# Patient Record
Sex: Male | Born: 1955 | Race: White | Hispanic: No | Marital: Married | Smoking: Former smoker
Health system: Southern US, Community
[De-identification: ages and names within clinical notes are randomized; demographics above are authoritative.]

## PROBLEM LIST (undated history)

## (undated) DIAGNOSIS — Z87442 Personal history of urinary calculi: Secondary | ICD-10-CM

## (undated) DIAGNOSIS — I714 Abdominal aortic aneurysm, without rupture, unspecified: Secondary | ICD-10-CM

## (undated) DIAGNOSIS — I639 Cerebral infarction, unspecified: Secondary | ICD-10-CM

## (undated) DIAGNOSIS — M1990 Unspecified osteoarthritis, unspecified site: Secondary | ICD-10-CM

## (undated) DIAGNOSIS — M545 Low back pain, unspecified: Secondary | ICD-10-CM

## (undated) DIAGNOSIS — G8929 Other chronic pain: Secondary | ICD-10-CM

## (undated) DIAGNOSIS — M25569 Pain in unspecified knee: Secondary | ICD-10-CM

## (undated) DIAGNOSIS — N200 Calculus of kidney: Secondary | ICD-10-CM

## (undated) DIAGNOSIS — G373 Acute transverse myelitis in demyelinating disease of central nervous system: Secondary | ICD-10-CM

## (undated) DIAGNOSIS — T4145XA Adverse effect of unspecified anesthetic, initial encounter: Secondary | ICD-10-CM

## (undated) DIAGNOSIS — T8859XA Other complications of anesthesia, initial encounter: Secondary | ICD-10-CM

## (undated) HISTORY — DX: Cerebral infarction, unspecified: I63.9

## (undated) HISTORY — PX: OTHER SURGICAL HISTORY: SHX169

## (undated) HISTORY — PX: BACK SURGERY: SHX140

## (undated) HISTORY — PX: KNEE SURGERY: SHX244

## (undated) HISTORY — PX: INCISION AND DRAINAGE PERIRECTAL ABSCESS: SHX1804

---
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
## 2002-05-24 DIAGNOSIS — I639 Cerebral infarction, unspecified: Secondary | ICD-10-CM

## 2002-05-24 DIAGNOSIS — G373 Acute transverse myelitis in demyelinating disease of central nervous system: Secondary | ICD-10-CM

## 2002-05-24 HISTORY — DX: Cerebral infarction, unspecified: I63.9

## 2002-05-24 HISTORY — DX: Acute transverse myelitis in demyelinating disease of central nervous system: G37.3

## 2011-04-24 HISTORY — PX: SPINE SURGERY: SHX786

## 2011-05-05 HISTORY — PX: ANAL SPHINCTEROTOMY: SHX1140

## 2011-05-31 ENCOUNTER — Encounter: Payer: Self-pay | Admitting: Vascular Surgery

## 2011-06-01 ENCOUNTER — Ambulatory Visit (INDEPENDENT_AMBULATORY_CARE_PROVIDER_SITE_OTHER): Payer: Medicare Other | Admitting: Vascular Surgery

## 2011-06-01 ENCOUNTER — Encounter: Payer: Self-pay | Admitting: Vascular Surgery

## 2011-06-01 VITALS — BP 127/84 | HR 52 | Resp 16 | Ht 73.0 in | Wt 195.0 lb

## 2011-06-01 DIAGNOSIS — I714 Abdominal aortic aneurysm, without rupture, unspecified: Secondary | ICD-10-CM | POA: Insufficient documentation

## 2011-06-01 DIAGNOSIS — Z01818 Encounter for other preprocedural examination: Secondary | ICD-10-CM

## 2011-06-01 NOTE — Progress Notes (Signed)
Addended by: Sharee Pimple on: 06/01/2011 11:37 AM   Modules accepted: Orders

## 2011-06-01 NOTE — Progress Notes (Signed)
Subjective:     Patient ID: Joshua Bailey, male   DOB: 1956-03-06, 56 y.o.   MRN: [id number]  HPI this 56 year old male was referred for an abdominal aortic aneurysm which was recently discovered by his medical doctor Dr.Ruehle in [facility] the patient was noted to have a pulsatile mass on physical exam and ultrasound confirmed a abdominal aortic aneurysm with a maximum diameter of 5.2 cm. He had no previous knowledge of the aneurysm. He has had no abdominal or new back pain. He does have chronic back problems and has had lumbar surgery on multiple occasions.  Past Medical History  Diagnosis Date  . Stroke 2004    History  Substance Use Topics  . Smoking status: Current Everyday Smoker -- 0.5 packs/day    Types: Cigarettes  . Smokeless tobacco: Not on file  . Alcohol Use: Not on file    Family History  Problem Relation Age of Onset  . Hyperlipidemia Mother   . Heart disease Father   . Hyperlipidemia Father   . Stroke Father   . Peripheral vascular disease Father   . Heart disease Brother   . Hyperlipidemia Brother     Not on File  Current outpatient prescriptions:ALPRAZolam (XANAX) 1 MG tablet, Take 1 mg by mouth at bedtime as needed.  , Disp: , Rfl: ;  ascorbic acid (VITAMIN C) 1000 MG tablet, Take 1,000 mg by mouth daily.  , Disp: , Rfl: ;  aspirin 81 MG tablet, Take 160 mg by mouth every other day.  , Disp: , Rfl: ;  cholecalciferol (VITAMIN D) 1000 UNITS tablet, Take 1,000 Units by mouth daily.  , Disp: , Rfl:  HYDROcodone-acetaminophen (NORCO) 10-325 MG per tablet, Take 1 tablet by mouth QID.  , Disp: , Rfl: ;  Omega-3 Fatty Acids (FISH OIL) 1200 MG CAPS, Take by mouth 2 (two) times daily after a meal.  , Disp: , Rfl:   BP 127/84  Pulse 52  Resp 16  Ht 6\' 1"  (1.854 m)  Wt 195 lb (88.451 kg)  BMI 25.73 kg/m2  SpO2 98%  Body mass index is 25.73 kg/(m^2).         Review of Systems he does complain of chronic back pain, numbness and some weakness on the left  side of his body since he had a stroke in 2005, denies chest pain, dyspnea on exertion, PND, orthopnea, hemoptysis, or abdominal GI symptoms. All other systems are negative.    Objective:   Physical Exam blood pressure 127/84 heart rate 60 respirations 16 General well-developed well-nourished male no apparent distress alert and oriented x3 HEENT normal for age Lungs no rhonchi or wheezing Cardiovascular regular rhythm no murmurs carotid pulses 3+ no audible bruits Abdomen soft. There is a 5-6 cm pulsatile mass in the periumbilical region. This is nontender. Neurologic slight decreased sensation in the left lower extremity otherwise normal Muscle skeletal free of major deformities Lower extremity exam reveals 3+ femoral popliteal dorsalis pedis and posterior tibial pulses palpable bilaterally.     Assessment:    abdominal aortic aneurysm 5.2 x 4.7 cm by ultrasound performed in December 2012 we'll schedule CT angiogram of the abdominal aorta and Cardiolite exam to evaluate for underlying coronary artery disease. Patient will return to see me in 2 weeks for further discussion    Plan:

## 2011-06-07 ENCOUNTER — Ambulatory Visit (HOSPITAL_COMMUNITY): Payer: Medicare Other | Attending: Vascular Surgery | Admitting: Radiology

## 2011-06-07 DIAGNOSIS — I739 Peripheral vascular disease, unspecified: Secondary | ICD-10-CM | POA: Insufficient documentation

## 2011-06-07 DIAGNOSIS — Z01818 Encounter for other preprocedural examination: Secondary | ICD-10-CM

## 2011-06-07 DIAGNOSIS — Z8249 Family history of ischemic heart disease and other diseases of the circulatory system: Secondary | ICD-10-CM | POA: Insufficient documentation

## 2011-06-07 DIAGNOSIS — Z01810 Encounter for preprocedural cardiovascular examination: Secondary | ICD-10-CM | POA: Insufficient documentation

## 2011-06-07 DIAGNOSIS — I2510 Atherosclerotic heart disease of native coronary artery without angina pectoris: Secondary | ICD-10-CM

## 2011-06-07 DIAGNOSIS — Z87891 Personal history of nicotine dependence: Secondary | ICD-10-CM | POA: Insufficient documentation

## 2011-06-07 DIAGNOSIS — I714 Abdominal aortic aneurysm, without rupture, unspecified: Secondary | ICD-10-CM | POA: Insufficient documentation

## 2011-06-07 MED ORDER — TECHNETIUM TC 99M TETROFOSMIN IV KIT
10.0000 | PACK | Freq: Once | INTRAVENOUS | Status: AC | PRN
  Administered 2011-06-07: 10 via INTRAVENOUS

## 2011-06-07 MED ORDER — REGADENOSON 0.4 MG/5ML IV SOLN
0.4000 mg | Freq: Once | INTRAVENOUS | Status: AC
  Administered 2011-06-07: 0.4 mg via INTRAVENOUS

## 2011-06-07 MED ORDER — TECHNETIUM TC 99M TETROFOSMIN IV KIT
30.0000 | PACK | Freq: Once | INTRAVENOUS | Status: AC | PRN
  Administered 2011-06-07: 30 via INTRAVENOUS

## 2011-06-07 NOTE — Progress Notes (Signed)
[facility] SITE 3 NUCLEAR MED [address] [place] [address] [phone]  Cardiology Nuclear Med Study  Joshua Bailey is a 56 y.o. male [id number] 09/24/1955   Nuclear Med Background Indication for Stress Test:  Evaluation for Ischemia and Pending Surgical Clearance: AAA surgery- Dr. Hart Rochester History:AAA 5.2cm Cardiac Risk Factors: CVA, Family History - CAD, PVD and Smoker  Symptoms: none   Nuclear Pre-Procedure Caffeine/Decaff Intake:  Decaf before 7am today, per patient. NPO After: 9:00pm   Lungs:  clear IV 0.9% NS with Angio Cath:  20g  IV Site: R Antecubital  IV Started by:  Stanton Kidney, EMT-P  Chest Size (in):  44 Cup Size: n/a  Height: 6\' 1"  (1.854 m)  Weight:  194 lb (87.998 kg)  BMI:  Body mass index is 25.60 kg/(m^2). Tech Comments:  NA    Nuclear Med Study 1 or 2 day study: 1 day  Stress Test Type:  Lexiscan  Reading MD: Dietrich Pates, MD  Order Authorizing Provider:  Hart Rochester  Resting Radionuclide: Technetium 25m Tetrofosmin  Resting Radionuclide Dose: 11.0 mCi   Stress Radionuclide:  Technetium 46m Tetrofosmin  Stress Radionuclide Dose: 33.0 mCi           Stress Protocol Rest HR: 53 Stress HR: 74  Rest BP: 116/75 Stress BP: 125/67  Exercise Time (min): n/a METS: n/a   Predicted Max HR: 165 bpm % Max HR: 44.85 bpm Rate Pressure Product: 9250   Dose of Adenosine (mg):  n/a Dose of Lexiscan: 0.4 mg  Dose of Atropine (mg): n/a Dose of Dobutamine: n/a mcg/kg/min (at max HR)  Stress Test Technologist: Milana Na, EMT-P  Nuclear Technologist:  Domenic Polite, CNMT     Rest Procedure:  Myocardial perfusion imaging was performed at rest 45 minutes following the intravenous administration of Technetium 67m Tetrofosmin. Rest ECG: SinusBradycardia  Stress Procedure:  The patient received IV Lexiscan 0.4 mg over 15-seconds.  Technetium 63m Tetrofosmin injected at 30-seconds.  There were no significant changes with Lexiscan.   Quantitative spect images were obtained after a 45 minute delay. Stress ECG: No significant change from baseline ECG  QPS Raw Data Images:  Soft tissue (diaphragm, bowel activity) underlie heart. Stress Images:  Thinning with decreased counts in the inferior wall (base, mid, distal), inferoseptal wall (base) and apex. Rest Images:  Incomplete improvement with improvement in the inferoseptal base, inferior distal; incomplete at apex. Subtraction (SDS):  Ischemia inferiorly. Transient Ischemic Dilatation (Normal <1.22):  1.09 Lung/Heart Ratio (Normal <0.45):  0.38  Quantitative Gated Spect Images QGS EDV:  144 ml QGS ESV:  68 ml QGS cine images:  Normal wall thickening. QGS EF: 53%  Impression Exercise Capacity:  Lexiscan with no exercise. BP Response:  Normal blood pressure response. Clinical Symptoms:  No chest pain. ECG Impression:  No significant ST segment change suggestive of ischemia. Comparison with Prior Nuclear Study: No prior scans to evaluate  Overall Impression:  Myoview scan with evidence of subendocardial scar and/or soft tissue attenuation with minimal ischemia.   Dietrich Pates

## 2011-06-10 ENCOUNTER — Emergency Department (HOSPITAL_COMMUNITY): Payer: Medicare Other

## 2011-06-10 ENCOUNTER — Encounter (HOSPITAL_COMMUNITY): Payer: Self-pay | Admitting: *Deleted

## 2011-06-10 ENCOUNTER — Other Ambulatory Visit: Payer: Self-pay

## 2011-06-10 ENCOUNTER — Observation Stay (HOSPITAL_COMMUNITY)
Admission: EM | Admit: 2011-06-10 | Discharge: 2011-06-11 | Disposition: A | Discharge status: Home / Self Care | Payer: Medicare Other | Attending: Internal Medicine | Admitting: Internal Medicine

## 2011-06-10 ENCOUNTER — Telehealth (HOSPITAL_COMMUNITY): Payer: Self-pay

## 2011-06-10 DIAGNOSIS — R55 Syncope and collapse: Principal | ICD-10-CM | POA: Diagnosis present

## 2011-06-10 DIAGNOSIS — Z87442 Personal history of urinary calculi: Secondary | ICD-10-CM

## 2011-06-10 DIAGNOSIS — G8929 Other chronic pain: Secondary | ICD-10-CM | POA: Insufficient documentation

## 2011-06-10 DIAGNOSIS — M545 Low back pain, unspecified: Secondary | ICD-10-CM | POA: Insufficient documentation

## 2011-06-10 DIAGNOSIS — Z8673 Personal history of transient ischemic attack (TIA), and cerebral infarction without residual deficits: Secondary | ICD-10-CM | POA: Insufficient documentation

## 2011-06-10 DIAGNOSIS — I714 Abdominal aortic aneurysm, without rupture, unspecified: Secondary | ICD-10-CM | POA: Insufficient documentation

## 2011-06-10 DIAGNOSIS — I723 Aneurysm of iliac artery: Secondary | ICD-10-CM | POA: Insufficient documentation

## 2011-06-10 DIAGNOSIS — D696 Thrombocytopenia, unspecified: Secondary | ICD-10-CM | POA: Diagnosis present

## 2011-06-10 DIAGNOSIS — R42 Dizziness and giddiness: Secondary | ICD-10-CM | POA: Insufficient documentation

## 2011-06-10 HISTORY — DX: Pain in unspecified knee: M25.569

## 2011-06-10 HISTORY — DX: Personal history of urinary calculi: Z87.442

## 2011-06-10 HISTORY — PX: LAMINECTOMY: SHX219

## 2011-06-10 HISTORY — DX: Other complications of anesthesia, initial encounter: T88.59XA

## 2011-06-10 HISTORY — DX: Abdominal aortic aneurysm, without rupture, unspecified: I71.40

## 2011-06-10 HISTORY — DX: Unspecified osteoarthritis, unspecified site: M19.90

## 2011-06-10 HISTORY — DX: Acute transverse myelitis in demyelinating disease of central nervous system: G37.3

## 2011-06-10 HISTORY — DX: Other chronic pain: G89.29

## 2011-06-10 HISTORY — DX: Low back pain: M54.5

## 2011-06-10 HISTORY — DX: Low back pain, unspecified: M54.50

## 2011-06-10 HISTORY — DX: Adverse effect of unspecified anesthetic, initial encounter: T41.45XA

## 2011-06-10 HISTORY — DX: Abdominal aortic aneurysm, without rupture: I71.4

## 2011-06-10 LAB — TYPE AND SCREEN
ABO/RH(D): A POS
Antibody Screen: NEGATIVE

## 2011-06-10 LAB — DIFFERENTIAL
Basophils Relative: 1 % (ref 0–1)
Eosinophils Absolute: 0.2 10*3/uL (ref 0.0–0.7)
Lymphs Abs: 1.9 10*3/uL (ref 0.7–4.0)
Monocytes Relative: 9 % (ref 3–12)
Neutro Abs: 2.7 10*3/uL (ref 1.7–7.7)
Neutrophils Relative %: 52 % (ref 43–77)

## 2011-06-10 LAB — URINE MICROSCOPIC-ADD ON

## 2011-06-10 LAB — CBC
Hemoglobin: 15.1 g/dL (ref 13.0–17.0)
Platelets: 147 10*3/uL — ABNORMAL LOW (ref 150–400)
RBC: 4.82 MIL/uL (ref 4.22–5.81)

## 2011-06-10 LAB — POCT I-STAT, CHEM 8
Chloride: 105 mEq/L (ref 96–112)
HCT: 46 % (ref 39.0–52.0)
Hemoglobin: 15.6 g/dL (ref 13.0–17.0)
Potassium: 4.2 mEq/L (ref 3.5–5.1)
Sodium: 143 mEq/L (ref 135–145)

## 2011-06-10 LAB — PROTIME-INR
INR: 0.96 (ref 0.00–1.49)
Prothrombin Time: 13 seconds (ref 11.6–15.2)

## 2011-06-10 LAB — URINALYSIS, ROUTINE W REFLEX MICROSCOPIC
Protein, ur: NEGATIVE mg/dL
Specific Gravity, Urine: 1.012 (ref 1.005–1.030)
Urobilinogen, UA: 0.2 mg/dL (ref 0.0–1.0)

## 2011-06-10 LAB — ABO/RH: ABO/RH(D): A POS

## 2011-06-10 LAB — CARDIAC PANEL(CRET KIN+CKTOT+MB+TROPI): CK, MB: 2.4 ng/mL (ref 0.3–4.0)

## 2011-06-10 MED ORDER — SODIUM CHLORIDE 0.9 % IV BOLUS (SEPSIS)
500.0000 mL | Freq: Once | INTRAVENOUS | Status: AC
  Administered 2011-06-10: 500 mL via INTRAVENOUS

## 2011-06-10 MED ORDER — HYDROMORPHONE HCL PF 1 MG/ML IJ SOLN
1.0000 mg | Freq: Once | INTRAMUSCULAR | Status: AC
  Administered 2011-06-10: 1 mg via INTRAVENOUS
  Filled 2011-06-10: qty 1

## 2011-06-10 MED ORDER — IOHEXOL 350 MG/ML SOLN
100.0000 mL | Freq: Once | INTRAVENOUS | Status: AC | PRN
  Administered 2011-06-10: 100 mL via INTRAVENOUS

## 2011-06-10 NOTE — ED Notes (Signed)
Pt states that since his radiology test on Monday he has been dizzy and has had 2 syncope episodes.

## 2011-06-10 NOTE — ED Notes (Signed)
Pt has diagnosed AAA and seeing dr. Hart Rochester.  Pt sts since Monday having dizzy spells and blacking out.  Pt is alert and talking and reports dizziness.  Pt is having back pain.  No chest pain

## 2011-06-10 NOTE — ED Notes (Signed)
Pt returned from CT scan.

## 2011-06-10 NOTE — Progress Notes (Signed)
[facility]  Joshua Bailey is 56 y.o. male being admitted to the hospital for syncope work-up.  Patient is known to service, Dr. Hart Rochester, for AAA.  His CTA was reviewed and it appears his geometry is compatible with EVAR.  His aneurysm is > 5.5 cm so he meets criteria for repair.  His intact AAA is completely unrelated to his syncopal sx.  He should undergoing work-up as such prior to proceeding with EVAR.  I will notify Dr. Hart Rochester of this patient's admission.   [name], MD [facility] Office: [phone] Pager: [phone]  06/10/2011, 7:17 PM

## 2011-06-10 NOTE — Telephone Encounter (Signed)
The patient's wife called regarding patient having intermittent black out spells of second duration x 5 especially when getting up in morning, frequent dizziness especially when turns head, and Intermittent pain between shoulder blades since Monday night 06/07/11. The last black-out was this am. The patient has not been having any chest pain or SOB per wife.The patient had a Lexiscan myoview on 06/07/11 for surgical clearance by Dr. Josephina Gip for AAA 5.3cm. I called and spoke with Dr. Candie Chroman nurse Okey Regal with instructions for patient to go my EMS to ED now for evaluation.The patient's wife notified. Irean Hong, RN.

## 2011-06-10 NOTE — ED Provider Notes (Signed)
History     CSN: [id number]  Arrival date & time 06/10/11  1448   First MD Initiated Contact with Patient 06/10/11 1532      Chief Complaint  Patient presents with  . Dizziness    (Consider location/radiation/quality/duration/timing/severity/associated sxs/prior treatment) Patient is a 56 y.o. male presenting with syncope. The history is provided by the patient and the spouse.  Loss of Consciousness This is a new problem. The current episode started in the past 7 days. The problem occurs constantly. The problem has been unchanged. Pertinent negatives include no chest pain, chills, fever, nausea, vomiting or weakness. Associated symptoms comments: The patient was recently diagnosed with a AAA, undergoing pre-surgical testing by Dr. Hart Rochester prior to repair. He reports that for the past 2 days, he has had significant lightheadedness with progressively decreasing movement, with multiple episodes syncope, now occurring after position change while supine. He denies pain, recent illness, vomiting. He has no nausea. .    Past Medical History  Diagnosis Date  . Stroke 2004  . Aortic aneurysm, abdominal     Past Surgical History  Procedure Date  . Spine surgery 04/2011    Lateral Internal sphinecterotomy  . Joint replacement 2007    Right knee Reconstrution  . Joint replacement 2008    Right knee repair  . Back surgery     Family History  Problem Relation Age of Onset  . Hyperlipidemia Mother   . Heart disease Father   . Hyperlipidemia Father   . Stroke Father   . Peripheral vascular disease Father   . Heart disease Brother   . Hyperlipidemia Brother     History  Substance Use Topics  . Smoking status: Current Everyday Smoker -- 0.5 packs/day    Types: Cigarettes  . Smokeless tobacco: Not on file  . Alcohol Use: No      Review of Systems  Constitutional: Negative for fever and chills.  HENT: Negative.   Eyes: Negative.  Negative for visual disturbance.  Respiratory:  Negative.  Negative for shortness of breath.   Cardiovascular: Positive for syncope. Negative for chest pain.  Gastrointestinal: Negative.  Negative for nausea and vomiting.  Musculoskeletal: Negative.   Skin: Negative.   Neurological: Positive for syncope and light-headedness. Negative for speech difficulty and weakness.    Allergies  Review of patient's allergies indicates no known allergies.  Home Medications   Current Outpatient Rx  Name Route Sig Dispense Refill  . ALPRAZOLAM 1 MG PO TABS Oral Take 1 mg by mouth at bedtime as needed. To help sleep.    . ASCORBIC ACID 1000 MG PO TABS Oral Take 1,000 mg by mouth daily.     . ASPIRIN EC 81 MG PO TBEC Oral Take 81 mg by mouth every other day.    Marland Kitchen VITAMIN D 1000 UNITS PO TABS Oral Take 2,000 Units by mouth daily.     Marland Kitchen HYDROCODONE-ACETAMINOPHEN 10-325 MG PO TABS Oral Take 1 tablet by mouth QID.     Marland Kitchen FISH OIL 1200 MG PO CAPS Oral Take 1,200 mg by mouth 2 (two) times daily.       BP 104/54  Pulse 54  Temp(Src) 98.5 F (36.9 C) (Oral)  Resp 18  SpO2 97%  Physical Exam  Constitutional: He appears well-developed and well-nourished.  HENT:  Head: Normocephalic.  Eyes: Conjunctivae are normal. Pupils are equal, round, and reactive to light.  Neck: Normal range of motion. Neck supple.  Cardiovascular: Normal rate and regular rhythm.   Pulmonary/Chest:  Effort normal and breath sounds normal.  Abdominal: Soft. Bowel sounds are normal. There is no tenderness. There is no rebound and no guarding.  Musculoskeletal: Normal range of motion. He exhibits no edema.  Neurological: He is alert. No cranial nerve deficit.  Skin: Skin is warm and dry. No rash noted.  Psychiatric: He has a normal mood and affect.    ED Course  Procedures (including critical care time)  Labs Reviewed  CBC - Abnormal; Notable for the following:    Platelets 147 (*)    All other components within normal limits  URINALYSIS, ROUTINE W REFLEX MICROSCOPIC -  Abnormal; Notable for the following:    Hgb urine dipstick TRACE (*)    Leukocytes, UA TRACE (*)    All other components within normal limits  DIFFERENTIAL  PROTIME-INR  APTT  CARDIAC PANEL(CRET KIN+CKTOT+MB+TROPI)  TYPE AND SCREEN  POCT I-STAT, CHEM 8  ABO/RH  URINE MICROSCOPIC-ADD ON  I-STAT, CHEM 8   Ct Angio Chest W/cm &/or Wo Cm  06/10/2011  *RADIOLOGY REPORT*  Clinical Data:  56 year old male with syncope and chest, abdominal and pelvic pain.  Known abdominal aortic aneurysm.  CT ANGIOGRAPHY CHEST, ABDOMEN AND PELVIS  Technique:  Multidetector CT imaging through the chest, abdomen and pelvis was performed using the standard protocol during bolus administration of intravenous contrast.  Multiplanar reconstructed images including MIPs were obtained and reviewed to evaluate the vascular anatomy.  Contrast: OMNIPAQUE IOHEXOL 350 MG/ML IV SOLN  Comparison:  None  CTA CHEST  Findings:  There is no evidence of thoracic aortic aneurysm or dissection. The heart and great vessels are unremarkable except for minimal coronary artery calcifications. There is no evidence of pleural or pericardial effusion. No enlarged lymph nodes are identified.  The lungs are clear. There is no evidence of airspace opacity, consolidation, nodules/mass or endobronchial/endotracheal lesions. No acute or suspicious bony abnormalities are noted.  Mild circumferential wall thickening of the visualized esophagus may represent esophagitis.  Review of the MIP images confirms the above findings.  IMPRESSION: No evidence of thoracic aortic aneurysm or dissection.  Mild circumferential esophageal wall thickening - esophagitis not excluded.  CTA ABDOMEN AND PELVIS  Findings:  A 5.2 x 6 cm infrarenal abdominal aortic aneurysm is noted extending to the bifurcation with a length of 7 cm. Aneurysms of the common iliac arteries are also identified with the right measuring 1.5 cm and the left measuring 1.7 cm. There is no evidence of  vascular dissection or aneurysm rupture/leak.  The liver, gallbladder, spleen, adrenal glands, and pancreas are unremarkable.  Multiple bilateral nonobstructing renal calculi are noted as well as mild to moderate to renal cortical thinning.  No free fluid, enlarged lymph nodes or biliary dilatation identified. The bowel and bladder are within normal limits. Prostate enlargement is present. No acute or suspicious bony abnormalities are identified.  Moderate degenerative changes in the lower lumbar spine identified.  Review of the MIP images confirms the above findings.  IMPRESSION: No evidence of acute abnormality.  5.2 x 6 cm infrarenal abdominal aortic aneurysm without dissection or leak/rupture.  Bilateral common iliac artery aneurysms.  Nonobstructing bilateral renal calculi  Prostate enlargement.  Original Report Authenticated By: Rosendo Gros, M.D.   Ct Cta Abd/pel W/cm &/or W/o Cm  06/10/2011  *RADIOLOGY REPORT*  Clinical Data:  56 year old male with syncope and chest, abdominal and pelvic pain.  Known abdominal aortic aneurysm.  CT ANGIOGRAPHY CHEST, ABDOMEN AND PELVIS  Technique:  Multidetector CT imaging through the  chest, abdomen and pelvis was performed using the standard protocol during bolus administration of intravenous contrast.  Multiplanar reconstructed images including MIPs were obtained and reviewed to evaluate the vascular anatomy.  Contrast: OMNIPAQUE IOHEXOL 350 MG/ML IV SOLN  Comparison:  None  CTA CHEST  Findings:  There is no evidence of thoracic aortic aneurysm or dissection. The heart and great vessels are unremarkable except for minimal coronary artery calcifications. There is no evidence of pleural or pericardial effusion. No enlarged lymph nodes are identified.  The lungs are clear. There is no evidence of airspace opacity, consolidation, nodules/mass or endobronchial/endotracheal lesions. No acute or suspicious bony abnormalities are noted.  Mild circumferential wall  thickening of the visualized esophagus may represent esophagitis.  Review of the MIP images confirms the above findings.  IMPRESSION: No evidence of thoracic aortic aneurysm or dissection.  Mild circumferential esophageal wall thickening - esophagitis not excluded.  CTA ABDOMEN AND PELVIS  Findings:  A 5.2 x 6 cm infrarenal abdominal aortic aneurysm is noted extending to the bifurcation with a length of 7 cm. Aneurysms of the common iliac arteries are also identified with the right measuring 1.5 cm and the left measuring 1.7 cm. There is no evidence of vascular dissection or aneurysm rupture/leak.  The liver, gallbladder, spleen, adrenal glands, and pancreas are unremarkable.  Multiple bilateral nonobstructing renal calculi are noted as well as mild to moderate to renal cortical thinning.  No free fluid, enlarged lymph nodes or biliary dilatation identified. The bowel and bladder are within normal limits. Prostate enlargement is present. No acute or suspicious bony abnormalities are identified.  Moderate degenerative changes in the lower lumbar spine identified.  Review of the MIP images confirms the above findings.  IMPRESSION: No evidence of acute abnormality.  5.2 x 6 cm infrarenal abdominal aortic aneurysm without dissection or leak/rupture.  Bilateral common iliac artery aneurysms.  Nonobstructing bilateral renal calculi  Prostate enlargement.  Original Report Authenticated By: Rosendo Gros, M.D.     No diagnosis found.    MDM  CT angio abd/pel/chest without dissection or rupture of AAA. Patient remains comfortable. Will admit for syncope work up. Dr. Ranae Palms discussed with vascular so they are aware of patient admission - [facility] paged.        Rodena Medin, PA-C 06/10/11 1850

## 2011-06-11 DIAGNOSIS — R55 Syncope and collapse: Secondary | ICD-10-CM

## 2011-06-11 LAB — CBC
HCT: 41.5 % (ref 39.0–52.0)
Hemoglobin: 13.9 g/dL (ref 13.0–17.0)
MCH: 31.4 pg (ref 26.0–34.0)
MCHC: 33.5 g/dL (ref 30.0–36.0)
RDW: 13.3 % (ref 11.5–15.5)

## 2011-06-11 LAB — COMPREHENSIVE METABOLIC PANEL
ALT: 19 U/L (ref 0–53)
AST: 13 U/L (ref 0–37)
Alkaline Phosphatase: 57 U/L (ref 39–117)
CO2: 27 mEq/L (ref 19–32)
Calcium: 8.7 mg/dL (ref 8.4–10.5)
GFR calc non Af Amer: >90 mL/min (ref >90)
Glucose, Bld: 89 mg/dL (ref 70–99)
Potassium: 4 mEq/L (ref 3.5–5.1)
Sodium: 145 mEq/L (ref 135–145)

## 2011-06-11 LAB — CREATININE, SERUM: GFR calc non Af Amer: >90 mL/min (ref >90)

## 2011-06-11 LAB — PHOSPHORUS: Phosphorus: 4.3 mg/dL (ref 2.3–4.6)

## 2011-06-11 MED ORDER — ASPIRIN EC 81 MG PO TBEC
81.0000 mg | DELAYED_RELEASE_TABLET | ORAL | Status: DC
  Administered 2011-06-11: 81 mg via ORAL
  Filled 2011-06-11: qty 1

## 2011-06-11 MED ORDER — ACETAMINOPHEN 325 MG PO TABS: 650.0000 mg | ORAL_TABLET | Freq: Four times a day (QID) | ORAL | Status: DC | PRN

## 2011-06-11 MED ORDER — ACETAMINOPHEN 650 MG RE SUPP: 650.0000 mg | Freq: Four times a day (QID) | RECTAL | Status: DC | PRN

## 2011-06-11 MED ORDER — ASPIRIN EC 81 MG PO TBEC
81.0000 mg | DELAYED_RELEASE_TABLET | Freq: Every day | ORAL | Status: DC
  Filled 2011-06-11: qty 1

## 2011-06-11 MED ORDER — OMEGA-3-ACID ETHYL ESTERS 1 G PO CAPS
1.0000 g | ORAL_CAPSULE | Freq: Every day | ORAL | Status: DC
  Administered 2011-06-11: 1 g via ORAL
  Filled 2011-06-11: qty 1

## 2011-06-11 MED ORDER — SODIUM CHLORIDE 0.9 % IV SOLN
INTRAVENOUS | Status: DC
  Administered 2011-06-11 (×2): via INTRAVENOUS

## 2011-06-11 MED ORDER — ONDANSETRON HCL 4 MG PO TABS: 4.0000 mg | ORAL_TABLET | Freq: Four times a day (QID) | ORAL | Status: DC | PRN

## 2011-06-11 MED ORDER — ENOXAPARIN SODIUM 40 MG/0.4ML ~~LOC~~ SOLN
40.0000 mg | SUBCUTANEOUS | Status: DC
  Administered 2011-06-11: 40 mg via SUBCUTANEOUS
  Filled 2011-06-11: qty 0.4

## 2011-06-11 MED ORDER — ONDANSETRON HCL 4 MG/2ML IJ SOLN: 4.0000 mg | Freq: Four times a day (QID) | INTRAMUSCULAR | Status: DC | PRN

## 2011-06-11 MED ORDER — MORPHINE SULFATE 2 MG/ML IJ SOLN
1.0000 mg | INTRAMUSCULAR | Status: DC | PRN
  Administered 2011-06-11: 1 mg via INTRAVENOUS
  Filled 2011-06-11: qty 1

## 2011-06-11 MED ORDER — VITAMIN D3 25 MCG (1000 UNIT) PO TABS
2000.0000 [IU] | ORAL_TABLET | Freq: Every day | ORAL | Status: DC
  Administered 2011-06-11: 2000 [IU] via ORAL
  Filled 2011-06-11: qty 2

## 2011-06-11 MED ORDER — HYDROCODONE-ACETAMINOPHEN 10-325 MG PO TABS
1.0000 | ORAL_TABLET | Freq: Four times a day (QID) | ORAL | Status: DC
  Administered 2011-06-11 (×2): 1 via ORAL
  Filled 2011-06-11 (×2): qty 1

## 2011-06-11 MED ORDER — ALPRAZOLAM 0.5 MG PO TABS
1.0000 mg | ORAL_TABLET | Freq: Every evening | ORAL | Status: DC | PRN
Start: 2011-06-11 — End: 2011-06-11

## 2011-06-11 MED ORDER — VITAMIN C 500 MG PO TABS
1000.0000 mg | ORAL_TABLET | Freq: Every day | ORAL | Status: DC
  Administered 2011-06-11: 1000 mg via ORAL
  Filled 2011-06-11: qty 2

## 2011-06-11 NOTE — H&P (Signed)
Joshua Bailey is an 56 y.o. male.   Chief Complaint: Syncope HPI: 56 YO with history of infrarenal AAA being followed by vascular surgery with plan to assess for surgical repair presenting with an episode of Syncopal episode today. Has had a few episodes lately. Mild dizziness, no NVD, no recent cold, no headache, no palpitations, no chest pain.   Past Medical History  Diagnosis Date  . Stroke 2004  . Aortic aneurysm, abdominal   . History of nephrolithiasis 06/10/11    "have 2 right now"  . Complication of anesthesia 06/10/11    "had laser OR on teeth; can not put ETT down; must go thru nose"  . 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 Arthritis     "back and some in my hands"  . Chronic lower back pain   . Chronic knee pain     right; "it never stops hurting"    Past Surgical History  Procedure Date  . Spine surgery 04/2011    Lateral Internal sphinecterotomy  . Back surgery   . Knee surgery 2007; 2008    unsuccessful right knee reconstruction; unsuccessful right knee reconstruction, 2nd time  . Pilonidal cyst / sinus excision 1976  . Laminectomy 06/10/11    "I've had the last 4 lumbar discs removed at different dates "  . Incision and drainage perirectal abscess ~ 1995  . Anal sphincterotomy 05/05/11    Family History  Problem Relation Age of Onset  . Hyperlipidemia Mother   . Heart disease Father   . Hyperlipidemia Father   . Stroke Father   . Peripheral vascular disease Father   . Heart disease Brother   . Hyperlipidemia Brother    Social History:  reports that he has been smoking Cigarettes.  He has a 17.5 pack-year smoking history. He has never used smokeless tobacco. He reports that he uses illicit drugs (Marijuana). He reports that he does not drink alcohol.  Allergies: No Known Allergies  Medications Prior to Admission  Medication Dose Route Frequency Provider Last  Rate Last Dose  . 0.9 %  sodium chloride infusion   Intravenous Continuous Lonia Blood, MD      . acetaminophen (TYLENOL) tablet 650 mg  650 mg Oral Q6H PRN Lonia Blood, MD       Or  . acetaminophen (TYLENOL) suppository 650 mg  650 mg Rectal Q6H PRN Lonia Blood, MD      . ALPRAZolam Prudy Feeler) tablet 1 mg  1 mg Oral QHS PRN Lonia Blood, MD      . aspirin EC tablet 81 mg  81 mg Oral QODAY Lonia Blood, MD      . aspirin EC tablet 81 mg  81 mg Oral Daily Lonia Blood, MD      . cholecalciferol (VITAMIN D) tablet 2,000 Units  2,000 Units Oral Daily Lonia Blood, MD      . enoxaparin (LOVENOX) injection 40 mg  40 mg Subcutaneous Q24H Lonia Blood, MD      . HYDROcodone-acetaminophen (NORCO) 10-325 MG per tablet 1 tablet  1 tablet Oral QID Lonia Blood, MD      . HYDROmorphone (DILAUDID) injection 1 mg  1 mg Intravenous Once Rodena Medin, PA-C   1 mg at 06/10/11 2258  . iohexol (OMNIPAQUE) 350 MG/ML injection 100 mL  100 mL Intravenous Once PRN Medication Radiologist, MD   100 mL at 06/10/11 1809  .  morphine 2 MG/ML injection 1 mg  1 mg Intravenous Q4H PRN Lonia Blood, MD      . omega-3 acid ethyl esters (LOVAZA) capsule 1 g  1 g Oral Daily Lonia Blood, MD      . ondansetron (ZOFRAN) tablet 4 mg  4 mg Oral Q6H PRN Lonia Blood, MD       Or  . ondansetron (ZOFRAN) injection 4 mg  4 mg Intravenous Q6H PRN Lonia Blood, MD      . sodium chloride 0.9 % bolus 500 mL  500 mL Intravenous Once Loren Racer, MD   500 mL at 06/10/11 1724  . vitamin C (ASCORBIC ACID) tablet 1,000 mg  1,000 mg Oral Daily Lonia Blood, MD       Medications Prior to Admission  Medication Sig Dispense Refill  . ALPRAZolam (XANAX) 1 MG tablet Take 1 mg by mouth at bedtime as needed. To help sleep.      Marland Kitchen ascorbic acid (VITAMIN C) 1000 MG tablet Take 1,000 mg by mouth daily.       . cholecalciferol (VITAMIN D) 1000 UNITS tablet Take 2,000 Units by mouth daily.       Marland Kitchen HYDROcodone-acetaminophen (NORCO) 10-325 MG per tablet Take 1  tablet by mouth QID.       Marland Kitchen Omega-3 Fatty Acids (FISH OIL) 1200 MG CAPS Take 1,200 mg by mouth 2 (two) times daily.         Results for orders placed during the hospital encounter of 06/10/11 (from the past 48 hour(s))  CBC     Status: Abnormal   Collection Time   06/10/11  4:11 PM      Component Value Range Comment   WBC 5.2  4.0 - 10.5 (K/uL)    RBC 4.82  4.22 - 5.81 (MIL/uL)    Hemoglobin 15.1  13.0 - 17.0 (g/dL)    HCT 82.9  56.2 - 13.0 (%)    MCV 93.4  78.0 - 100.0 (fL)    MCH 31.3  26.0 - 34.0 (pg)    MCHC 33.6  30.0 - 36.0 (g/dL)    RDW 86.5  78.4 - 69.6 (%)    Platelets 147 (*) 150 - 400 (K/uL)   DIFFERENTIAL     Status: Normal   Collection Time   06/10/11  4:11 PM      Component Value Range Comment   Neutrophils Relative 52  43 - 77 (%)    Neutro Abs 2.7  1.7 - 7.7 (K/uL)    Lymphocytes Relative 36  12 - 46 (%)    Lymphs Abs 1.9  0.7 - 4.0 (K/uL)    Monocytes Relative 9  3 - 12 (%)    Monocytes Absolute 0.5  0.1 - 1.0 (K/uL)    Eosinophils Relative 3  0 - 5 (%)    Eosinophils Absolute 0.2  0.0 - 0.7 (K/uL)    Basophils Relative 1  0 - 1 (%)    Basophils Absolute 0.0  0.0 - 0.1 (K/uL)   PROTIME-INR     Status: Normal   Collection Time   06/10/11  4:11 PM      Component Value Range Comment   Prothrombin Time 13.0  11.6 - 15.2 (seconds)    INR 0.96  0.00 - 1.49    APTT     Status: Normal   Collection Time   06/10/11  4:11 PM      Component Value Range Comment   aPTT 32  24 -  37 (seconds)   CARDIAC PANEL(CRET KIN+CKTOT+MB+TROPI)     Status: Normal   Collection Time   06/10/11  4:19 PM      Component Value Range Comment   Total CK 67  7 - 232 (U/L)    CK, MB 2.4  0.3 - 4.0 (ng/mL)    Troponin I <0.30  <0.30 (ng/mL)    Relative Index RELATIVE INDEX IS INVALID  0.0 - 2.5    TYPE AND SCREEN     Status: Normal   Collection Time   06/10/11  4:30 PM      Component Value Range Comment   ABO/RH(D) A POS      Antibody Screen NEG      Sample Expiration 06/13/2011       ABO/RH     Status: Normal   Collection Time   06/10/11  4:30 PM      Component Value Range Comment   ABO/RH(D) A POS     POCT I-STAT, CHEM 8     Status: Normal   Collection Time   06/10/11  4:40 PM      Component Value Range Comment   Sodium 143  135 - 145 (mEq/L)    Potassium 4.2  3.5 - 5.1 (mEq/L)    Chloride 105  96 - 112 (mEq/L)    BUN 14  6 - 23 (mg/dL)    Creatinine, Ser 9.60  0.50 - 1.35 (mg/dL)    Glucose, Bld 76  70 - 99 (mg/dL)    Calcium, Ion 4.54  1.12 - 1.32 (mmol/L)    TCO2 28  0 - 100 (mmol/L)    Hemoglobin 15.6  13.0 - 17.0 (g/dL)    HCT 09.8  11.9 - 14.7 (%)   URINALYSIS, ROUTINE W REFLEX MICROSCOPIC     Status: Abnormal   Collection Time   06/10/11  4:49 PM      Component Value Range Comment   Color, Urine YELLOW  YELLOW     APPearance CLEAR  CLEAR     Specific Gravity, Urine 1.012  1.005 - 1.030     pH 6.0  5.0 - 8.0     Glucose, UA NEGATIVE  NEGATIVE (mg/dL)    Hgb urine dipstick TRACE (*) NEGATIVE     Bilirubin Urine NEGATIVE  NEGATIVE     Ketones, ur NEGATIVE  NEGATIVE (mg/dL)    Protein, ur NEGATIVE  NEGATIVE (mg/dL)    Urobilinogen, UA 0.2  0.0 - 1.0 (mg/dL)    Nitrite NEGATIVE  NEGATIVE     Leukocytes, UA TRACE (*) NEGATIVE    URINE MICROSCOPIC-ADD ON     Status: Normal   Collection Time   06/10/11  4:49 PM      Component Value Range Comment   Squamous Epithelial / LPF RARE  RARE     WBC, UA 0-2  <3 (WBC/hpf)    RBC / HPF 3-6  <3 (RBC/hpf)    Bacteria, UA RARE  RARE     Ct Head Wo Contrast  06/10/2011  *RADIOLOGY REPORT*  Clinical Data: Dizziness, syncope  CT HEAD WITHOUT CONTRAST  Technique:  Contiguous axial images were obtained from the base of the skull through the vertex without contrast.  Comparison: None.  Findings: No evidence of parenchymal hemorrhage or extra-axial fluid collection. No mass lesion, mass effect, or midline shift.  No CT evidence of acute infarction.  Cerebral volume is age appropriate.  No ventriculomegaly.  The visualized  paranasal sinuses are essentially clear. The  mastoid air cells are unopacified.  No evidence of calvarial fracture.  IMPRESSION: Normal head CT.  Original Report Authenticated By: Charline Bills, M.D.   Ct Angio Chest W/cm &/or Wo Cm  06/10/2011  *RADIOLOGY REPORT*  Clinical Data:  56 year old male with syncope and chest, abdominal and pelvic pain.  Known abdominal aortic aneurysm.  CT ANGIOGRAPHY CHEST, ABDOMEN AND PELVIS  Technique:  Multidetector CT imaging through the chest, abdomen and pelvis was performed using the standard protocol during bolus administration of intravenous contrast.  Multiplanar reconstructed images including MIPs were obtained and reviewed to evaluate the vascular anatomy.  Contrast: OMNIPAQUE IOHEXOL 350 MG/ML IV SOLN  Comparison:  None  CTA CHEST  Findings:  There is no evidence of thoracic aortic aneurysm or dissection. The heart and great vessels are unremarkable except for minimal coronary artery calcifications. There is no evidence of pleural or pericardial effusion. No enlarged lymph nodes are identified.  The lungs are clear. There is no evidence of airspace opacity, consolidation, nodules/mass or endobronchial/endotracheal lesions. No acute or suspicious bony abnormalities are noted.  Mild circumferential wall thickening of the visualized esophagus may represent esophagitis.  Review of the MIP images confirms the above findings.  IMPRESSION: No evidence of thoracic aortic aneurysm or dissection.  Mild circumferential esophageal wall thickening - esophagitis not excluded.  CTA ABDOMEN AND PELVIS  Findings:  A 5.2 x 6 cm infrarenal abdominal aortic aneurysm is noted extending to the bifurcation with a length of 7 cm. Aneurysms of the common iliac arteries are also identified with the right measuring 1.5 cm and the left measuring 1.7 cm. There is no evidence of vascular dissection or aneurysm rupture/leak.  The liver, gallbladder, spleen, adrenal glands, and pancreas are  unremarkable.  Multiple bilateral nonobstructing renal calculi are noted as well as mild to moderate to renal cortical thinning.  No free fluid, enlarged lymph nodes or biliary dilatation identified. The bowel and bladder are within normal limits. Prostate enlargement is present. No acute or suspicious bony abnormalities are identified.  Moderate degenerative changes in the lower lumbar spine identified.  Review of the MIP images confirms the above findings.  IMPRESSION: No evidence of acute abnormality.  5.2 x 6 cm infrarenal abdominal aortic aneurysm without dissection or leak/rupture.  Bilateral common iliac artery aneurysms.  Nonobstructing bilateral renal calculi  Prostate enlargement.  Original Report Authenticated By: Rosendo Gros, M.D.   Ct Cta Abd/pel W/cm &/or W/o Cm  06/10/2011  *RADIOLOGY REPORT*  Clinical Data:  56 year old male with syncope and chest, abdominal and pelvic pain.  Known abdominal aortic aneurysm.  CT ANGIOGRAPHY CHEST, ABDOMEN AND PELVIS  Technique:  Multidetector CT imaging through the chest, abdomen and pelvis was performed using the standard protocol during bolus administration of intravenous contrast.  Multiplanar reconstructed images including MIPs were obtained and reviewed to evaluate the vascular anatomy.  Contrast: OMNIPAQUE IOHEXOL 350 MG/ML IV SOLN  Comparison:  None  CTA CHEST  Findings:  There is no evidence of thoracic aortic aneurysm or dissection. The heart and great vessels are unremarkable except for minimal coronary artery calcifications. There is no evidence of pleural or pericardial effusion. No enlarged lymph nodes are identified.  The lungs are clear. There is no evidence of airspace opacity, consolidation, nodules/mass or endobronchial/endotracheal lesions. No acute or suspicious bony abnormalities are noted.  Mild circumferential wall thickening of the visualized esophagus may represent esophagitis.  Review of the MIP images confirms the above findings.   IMPRESSION: No evidence of  thoracic aortic aneurysm or dissection.  Mild circumferential esophageal wall thickening - esophagitis not excluded.  CTA ABDOMEN AND PELVIS  Findings:  A 5.2 x 6 cm infrarenal abdominal aortic aneurysm is noted extending to the bifurcation with a length of 7 cm. Aneurysms of the common iliac arteries are also identified with the right measuring 1.5 cm and the left measuring 1.7 cm. There is no evidence of vascular dissection or aneurysm rupture/leak.  The liver, gallbladder, spleen, adrenal glands, and pancreas are unremarkable.  Multiple bilateral nonobstructing renal calculi are noted as well as mild to moderate to renal cortical thinning.  No free fluid, enlarged lymph nodes or biliary dilatation identified. The bowel and bladder are within normal limits. Prostate enlargement is present. No acute or suspicious bony abnormalities are identified.  Moderate degenerative changes in the lower lumbar spine identified.  Review of the MIP images confirms the above findings.  IMPRESSION: No evidence of acute abnormality.  5.2 x 6 cm infrarenal abdominal aortic aneurysm without dissection or leak/rupture.  Bilateral common iliac artery aneurysms.  Nonobstructing bilateral renal calculi  Prostate enlargement.  Original Report Authenticated By: Rosendo Gros, M.D.    Review of Systems  Constitutional: Negative.   HENT: Negative.   Eyes: Negative.   Respiratory: Negative.   Cardiovascular: Negative.   Gastrointestinal: Negative.   Genitourinary: Negative.   Musculoskeletal: Negative.   Skin: Negative.   Neurological: Negative.   Endo/Heme/Allergies: Negative.   Psychiatric/Behavioral: Negative.     Blood pressure 111/67, pulse 54, temperature 98.5 F (36.9 C), temperature source Oral, resp. rate 20, SpO2 95.00%. Physical Exam  Constitutional: He is oriented to person, place, and time. He appears well-developed and well-nourished.  HENT:  Head: Normocephalic and atraumatic.    Right Ear: External ear normal.  Left Ear: External ear normal.  Nose: Nose normal.  Mouth/Throat: Oropharynx is clear and moist.  Eyes: Conjunctivae and EOM are normal. Pupils are equal, round, and reactive to light.  Neck: Normal range of motion. Neck supple.  Cardiovascular: Normal rate, regular rhythm, normal heart sounds and intact distal pulses.   Respiratory: Effort normal and breath sounds normal.  GI: Soft. Bowel sounds are normal. He exhibits abdominal bruit. He exhibits no shifting dullness, no distension and no pulsatile liver. There is no tenderness.  Musculoskeletal: Normal range of motion.  Neurological: He is alert and oriented to person, place, and time. He has normal reflexes.  Skin: Skin is warm and dry.  Psychiatric: He has a normal mood and affect. His behavior is normal. Judgment and thought content normal.     Assessment/Plan 1. Syncope: Probably vaso-vagal. Evaluation so far shoed no evidence of his AAA contributing to his symptoms. Will check Cardiac causes, place on Tele, check enzymes, echo and orthostasis. 2. AAA: Vascular on board for possible repair 3. Thrombocytopenia: Mild. Follow platelets level  GARBA,LAWAL 06/11/2011, 1:28 AM

## 2011-06-11 NOTE — Progress Notes (Signed)
*  PRELIMINARY RESULTS*  Carotid Doppler has been performed. Bilateral:  No evidence of hemodynamically significant internal carotid artery stenosis.   Vertebral artery flow is antegrade.     Farrel Demark RDMS 06/11/2011, 11:19 AM

## 2011-06-11 NOTE — Progress Notes (Signed)
*  PRELIMINARY RESULTS* Echocardiogram 2D Echocardiogram has been performed.  Clide Deutscher 06/11/2011, 12:27 PM

## 2011-06-11 NOTE — Progress Notes (Signed)
Patient ID: Joshua Bailey, male   DOB: 06/08/1955, 55 y.o.   MRN: [id number] Vascular Surgery Progress Note  Subjective: None AAA admitted with short duration syncopal episode  Objective:  Filed Vitals:   06/11/11 1200  BP: 152/89  Pulse: 55  Temp: 98.7 F (37.1 C)  Resp: 20    Patient has had a few short duration syncopal episodes and is being evaluated for this during this hospitalization. He was in the process of being evaluated for an aortic aneurysm stent graft repair. He had a CT angiogram today which I have reviewed. He is a good candidate for aortic stent grafting. Aneurysm now to 6 cm in maximum diameter. He had a Cardiolite performed which is a low risk study.   Labs:  Lab 06/11/11 0537 06/11/11 0118 06/10/11 1640  CREATININE 0.84 0.87 1.00    Lab 06/11/11 0537 06/11/11 0118 06/10/11 1640  NA 145 -- 143  K 4.0 -- 4.2  CL 110 -- 105  CO2 27 -- --  BUN 15 -- 14  CREATININE 0.84 0.87 1.00  LABGLOM -- -- --  GLUCOSE 89 -- --  CALCIUM 8.7 -- --    Lab 06/11/11 0537 06/10/11 1640 06/10/11 1611  WBC 4.5 -- 5.2  HGB 13.9 15.6 15.1  HCT 41.5 46.0 45.0  PLT 145* -- 147*    Lab 06/10/11 1611  INR 0.96    I/O last 3 completed shifts: In: -  Out: 625 [Urine:625]  Imaging: Ct Head Wo Contrast  06/10/2011  *RADIOLOGY REPORT*  Clinical Data: Dizziness, syncope  CT HEAD WITHOUT CONTRAST  Technique:  Contiguous axial images were obtained from the base of the skull through the vertex without contrast.  Comparison: None.  Findings: No evidence of parenchymal hemorrhage or extra-axial fluid collection. No mass lesion, mass effect, or midline shift.  No CT evidence of acute infarction.  Cerebral volume is age appropriate.  No ventriculomegaly.  The visualized paranasal sinuses are essentially clear. The mastoid air cells are unopacified.  No evidence of calvarial fracture.  IMPRESSION: Normal head CT.  Original Report Authenticated By: SRIYESH KRISHNAN, M.D.   Ct Angio  Chest W/cm &/or Wo Cm  06/10/2011  *RADIOLOGY REPORT*  Clinical Data:  55-year-old male with syncope and chest, abdominal and pelvic pain.  Known abdominal aortic aneurysm.  CT ANGIOGRAPHY CHEST, ABDOMEN AND PELVIS  Technique:  Multidetector CT imaging through the chest, abdomen and pelvis was performed using the standard protocol during bolus administration of intravenous contrast.  Multiplanar reconstructed images including MIPs were obtained and reviewed to evaluate the vascular anatomy.  Contrast: 100mL OMNIPAQUE IOHEXOL 350 MG/ML IV SOLN  Comparison:  None  CTA CHEST  Findings:  There is no evidence of thoracic aortic aneurysm or dissection. The heart and great vessels are unremarkable except for minimal coronary artery calcifications. There is no evidence of pleural or pericardial effusion. No enlarged lymph nodes are identified.  The lungs are clear. There is no evidence of airspace opacity, consolidation, nodules/mass or endobronchial/endotracheal lesions. No acute or suspicious bony abnormalities are noted.  Mild circumferential wall thickening of the visualized esophagus may represent esophagitis.  Review of the MIP images confirms the above findings.  IMPRESSION: No evidence of thoracic aortic aneurysm or dissection.  Mild circumferential esophageal wall thickening - esophagitis not excluded.  CTA ABDOMEN AND PELVIS  Findings:  A 5.2 x 6 cm infrarenal abdominal aortic aneurysm is noted extending to the bifurcation with a length of 7 cm. Aneurysms of the common   iliac arteries are also identified with the right measuring 1.5 cm and the left measuring 1.7 cm. There is no evidence of vascular dissection or aneurysm rupture/leak.  The liver, gallbladder, spleen, adrenal glands, and pancreas are unremarkable.  Multiple bilateral nonobstructing renal calculi are noted as well as mild to moderate to renal cortical thinning.  No free fluid, enlarged lymph nodes or biliary dilatation identified. The bowel and  bladder are within normal limits. Prostate enlargement is present. No acute or suspicious bony abnormalities are identified.  Moderate degenerative changes in the lower lumbar spine identified.  Review of the MIP images confirms the above findings.  IMPRESSION: No evidence of acute abnormality.  5.2 x 6 cm infrarenal abdominal aortic aneurysm without dissection or leak/rupture.  Bilateral common iliac artery aneurysms.  Nonobstructing bilateral renal calculi  Prostate enlargement.  Original Report Authenticated By: Zailyn T. HU, M.D.   Ct Cta Abd/pel W/cm &/or W/o Cm  06/10/2011  *RADIOLOGY REPORT*  Clinical Data:  55-year-old male with syncope and chest, abdominal and pelvic pain.  Known abdominal aortic aneurysm.  CT ANGIOGRAPHY CHEST, ABDOMEN AND PELVIS  Technique:  Multidetector CT imaging through the chest, abdomen and pelvis was performed using the standard protocol during bolus administration of intravenous contrast.  Multiplanar reconstructed images including MIPs were obtained and reviewed to evaluate the vascular anatomy.  Contrast: 100mL OMNIPAQUE IOHEXOL 350 MG/ML IV SOLN  Comparison:  None  CTA CHEST  Findings:  There is no evidence of thoracic aortic aneurysm or dissection. The heart and great vessels are unremarkable except for minimal coronary artery calcifications. There is no evidence of pleural or pericardial effusion. No enlarged lymph nodes are identified.  The lungs are clear. There is no evidence of airspace opacity, consolidation, nodules/mass or endobronchial/endotracheal lesions. No acute or suspicious bony abnormalities are noted.  Mild circumferential wall thickening of the visualized esophagus may represent esophagitis.  Review of the MIP images confirms the above findings.  IMPRESSION: No evidence of thoracic aortic aneurysm or dissection.  Mild circumferential esophageal wall thickening - esophagitis not excluded.  CTA ABDOMEN AND PELVIS  Findings:  A 5.2 x 6 cm infrarenal  abdominal aortic aneurysm is noted extending to the bifurcation with a length of 7 cm. Aneurysms of the common iliac arteries are also identified with the right measuring 1.5 cm and the left measuring 1.7 cm. There is no evidence of vascular dissection or aneurysm rupture/leak.  The liver, gallbladder, spleen, adrenal glands, and pancreas are unremarkable.  Multiple bilateral nonobstructing renal calculi are noted as well as mild to moderate to renal cortical thinning.  No free fluid, enlarged lymph nodes or biliary dilatation identified. The bowel and bladder are within normal limits. Prostate enlargement is present. No acute or suspicious bony abnormalities are identified.  Moderate degenerative changes in the lower lumbar spine identified.  Review of the MIP images confirms the above findings.  IMPRESSION: No evidence of acute abnormality.  5.2 x 6 cm infrarenal abdominal aortic aneurysm without dissection or leak/rupture.  Bilateral common iliac artery aneurysms.  Nonobstructing bilateral renal calculi  Prostate enlargement.  Original Report Authenticated By: Evian T. HU, M.D.    Assessment/Plan:    LOS: 1 day  s/p   Would continue workup for his syncopal episodes as you are doing. I am scheduling his aortic stent graft repair for Friday, February 25. He can be discharged following his current evaluation. I have discussed this with him and will notify him of the date of proposed surgery.     James Lawson, MD 06/11/2011 1:07 PM             

## 2011-06-11 NOTE — Consult Note (Signed)
Pt smokes 1/2 ppd and wants to quit in action stage. Recommended 14 mg patches x 2 weeks, 7 mg patch x 2 weeks. Discussed patch use instructions. Referred to 1-800 quit now for f/u and support. Discussed oral fixation substitutes, second hand smoke and in home smoking policy. Reviewed and gave pt Written education/contact information.

## 2011-06-11 NOTE — Discharge Summary (Signed)
Physician Discharge Summary  Joshua Bailey ID: Joshua Bailey MRN: [id number] DOB/AGE: Mar 26, 1956 56 y.o.  Admit date: 06/10/2011 Discharge date: 06/11/2011  Primary Care Physician:  Forrest Moron, MD, MD  Discharge Diagnoses:    .Syncope .Abdominal aneurysm without mention of rupture 5.2 x 6 cm infrarenal abdominal aortic aneurysm   .Thrombocytopenia  History of CVA  History of chronic lower back pain and knee pain  Consults:  Quita Skye. Hart Rochester, vascular surgery   Discharge Medications: Current Discharge Medication List    CONTINUE these medications which have NOT CHANGED   Details  ALPRAZolam (XANAX) 1 MG tablet Take 1 mg by mouth at bedtime as needed. To help sleep.    ascorbic acid (VITAMIN C) 1000 MG tablet Take 1,000 mg by mouth daily.     aspirin EC 81 MG tablet Take 81 mg by mouth every other day.    cholecalciferol (VITAMIN D) 1000 UNITS tablet Take 2,000 Units by mouth daily.     HYDROcodone-acetaminophen (NORCO) 10-325 MG per tablet Take 1 tablet by mouth QID.     Omega-3 Fatty Acids (FISH OIL) 1200 MG CAPS Take 1,200 mg by mouth 2 (two) times daily.          Brief H and P: For complete details please refer to admission H and P, but in brief Joshua Bailey is Joshua 56 year old Bailey with history of infrarenal AAA followed vascular surgery with the plan to assess for surgical repair presented with syncopal episode. Joshua Bailey was admitted for syncopal workup.  Hospital Course:  Principal Problem:  *Syncope: Likely vasovagal - Joshua Bailey was admitted for further workup, on telemetry floor. Joshua Bailey underwent urgent CT angiogram of the abdomen which showed 5.2 x 6 cm infrarenal abdominal aortic aneurysm without dissection or leak/rupture. CT of the head was normal without any acute stroke or intracranial abnormality. Carotid Doppler Ultrasound was negative for any hemodynamically significant internal carotid artery stenosis. 2-D echocardiogram was done which showed EF of 50-55%. He also  had Joshua Cardiolite performed on 06/08/2011 which showed minimal reversible ischemia and preserved EF of 55%  Active Problems:  Abdominal aneurysm without mention of rupture: Vascular surgery was consulted, Joshua Bailey was seen by Dr. Hart Rochester. Temp recommendations, he scheduled her for aortic stent graft repair on January 25..   Thrombocytopenia: Stable   Day of Discharge BP 152/89  Pulse 55  Temp(Src) 98.7 F (37.1 C) (Oral)  Resp 20  SpO2 96%  Physical Exam: General: Alert and awake oriented x3 not in any acute distress. HEENT: anicteric sclera, pupils reactive to light and accommodation CVS: S1-S2 clear no murmur rubs or gallops Chest: clear to auscultation bilaterally, no wheezing rales or rhonchi Abdomen: soft nontender, nondistended, normal bowel sounds, no organomegaly Extremities: no cyanosis, clubbing or edema noted bilaterally Neuro: Cranial nerves II-XII intact, no focal neurological deficits   The results of significant diagnostics from this hospitalization (including imaging, microbiology, ancillary and laboratory) are listed below for reference.    LAB RESULTS: Basic Metabolic Panel:  Lab 06/11/11 4098 06/11/11 0118 06/10/11 1640  NA 145 -- 143  K 4.0 -- 4.2  CL 110 -- 105  CO2 27 -- --  GLUCOSE 89 -- 76  BUN 15 -- 14  CREATININE 0.84 0.87 --  CALCIUM 8.7 -- --  MG -- 1.9 --  PHOS -- 4.3 --   Liver Function Tests:  Lab 06/11/11 0537  AST 13  ALT 19  ALKPHOS 57  BILITOT 0.3  PROT 5.7*  ALBUMIN 3.1*   CBC:  Lab  06/11/11 0537 06/10/11 1640 06/10/11 1611  WBC 4.5 -- 5.2  NEUTROABS -- -- 2.7  HGB 13.9 15.6 --  HCT 41.5 46.0 --  MCV 93.7 -- --  PLT 145* -- 147*   Cardiac Enzymes:  Lab 06/10/11 1619  CKTOTAL 67  CKMB 2.4  CKMBINDEX --  TROPONINI <0.30    Significant Diagnostic Studies:  No results found.   Disposition and Follow-up: Discharge Orders    Future Orders Please Complete By Expires   Diet - low sodium heart healthy       Increase activity slowly          DISPOSITION: Home  DIET: Heart healthy diet  ACTIVITY: As tolerated    DISCHARGE FOLLOW-UP Follow-up Information    Follow up with Forrest Moron, MD. Schedule an appointment as soon as possible for Joshua visit in 2 weeks.      Follow up with Pryor Ochoa, MD on 06/18/2011. (please call asap to confirm the appointment)    Contact information:   [address] [place] [address] [phone]          Time spent on Discharge: 45 minutes  Signed:  Mechele Kittleson M.D. [facility] 06/11/2011, 2:38 PM

## 2011-06-14 ENCOUNTER — Encounter (HOSPITAL_COMMUNITY): Payer: Self-pay | Admitting: Pharmacy Technician

## 2011-06-14 ENCOUNTER — Other Ambulatory Visit: Payer: Self-pay | Admitting: *Deleted

## 2011-06-14 ENCOUNTER — Other Ambulatory Visit: Payer: Medicare Other

## 2011-06-14 NOTE — ED Provider Notes (Signed)
Medical screening examination/treatment/procedure(s) were conducted as a shared visit with non-physician practitioner(s) and myself.  I personally evaluated the patient during the encounter  Concern for AAA leak. CT without evidence of such. Discussed with vascular surgeon on call. Will follow when admitted for syncope w/u by [facility].   Loren Racer, MD 06/14/11 [phone]

## 2011-06-15 ENCOUNTER — Ambulatory Visit: Payer: Medicare Other | Admitting: Vascular Surgery

## 2011-06-15 ENCOUNTER — Encounter (HOSPITAL_COMMUNITY): Payer: Self-pay | Admitting: *Deleted

## 2011-06-17 MED ORDER — SODIUM CHLORIDE 0.9 % IV SOLN: INTRAVENOUS | Status: DC

## 2011-06-17 MED ORDER — VANCOMYCIN HCL IN DEXTROSE 1-5 GM/200ML-% IV SOLN
1000.0000 mg | INTRAVENOUS | Status: AC
  Administered 2011-06-18: 1000 mg via INTRAVENOUS
  Filled 2011-06-17: qty 200

## 2011-06-18 ENCOUNTER — Encounter (HOSPITAL_COMMUNITY)
Admission: RE | Disposition: A | Discharge status: Home / Self Care | Payer: Self-pay | Source: Ambulatory Visit | Attending: Vascular Surgery

## 2011-06-18 ENCOUNTER — Ambulatory Visit (HOSPITAL_COMMUNITY): Payer: Medicare Other

## 2011-06-18 ENCOUNTER — Ambulatory Visit (HOSPITAL_COMMUNITY): Payer: Medicare Other | Admitting: Anesthesiology

## 2011-06-18 ENCOUNTER — Encounter (HOSPITAL_COMMUNITY): Payer: Self-pay | Admitting: Anesthesiology

## 2011-06-18 ENCOUNTER — Inpatient Hospital Stay (HOSPITAL_COMMUNITY)
Admission: RE | Admit: 2011-06-18 | Discharge: 2011-06-19 | DRG: 238 | Disposition: A | Discharge status: Home / Self Care | Payer: Medicare Other | Source: Ambulatory Visit | Attending: Vascular Surgery | Admitting: Vascular Surgery

## 2011-06-18 DIAGNOSIS — I714 Abdominal aortic aneurysm, without rupture, unspecified: Principal | ICD-10-CM | POA: Diagnosis present

## 2011-06-18 DIAGNOSIS — D696 Thrombocytopenia, unspecified: Secondary | ICD-10-CM | POA: Diagnosis present

## 2011-06-18 DIAGNOSIS — Z7982 Long term (current) use of aspirin: Secondary | ICD-10-CM

## 2011-06-18 DIAGNOSIS — Z79899 Other long term (current) drug therapy: Secondary | ICD-10-CM

## 2011-06-18 DIAGNOSIS — D62 Acute posthemorrhagic anemia: Secondary | ICD-10-CM | POA: Diagnosis not present

## 2011-06-18 HISTORY — PX: ENDOVASCULAR STENT INSERTION: SHX5161

## 2011-06-18 LAB — APTT: aPTT: 33 seconds (ref 24–37)

## 2011-06-18 LAB — CBC
MCH: 32.4 pg (ref 26.0–34.0)
MCH: 31.3 pg (ref 26.0–34.0)
MCHC: 35.2 g/dL (ref 30.0–36.0)
MCHC: 34.2 g/dL (ref 30.0–36.0)
Platelets: 160 10*3/uL (ref 150–400)
Platelets: 130 10*3/uL — ABNORMAL LOW (ref 150–400)
RBC: 4.75 MIL/uL (ref 4.22–5.81)
RDW: 13.1 % (ref 11.5–15.5)

## 2011-06-18 LAB — COMPREHENSIVE METABOLIC PANEL
ALT: 22 U/L (ref 0–53)
AST: 14 U/L (ref 0–37)
Albumin: 3.8 g/dL (ref 3.5–5.2)
CO2: 23 mEq/L (ref 19–32)
Calcium: 9.1 mg/dL (ref 8.4–10.5)
Sodium: 141 mEq/L (ref 135–145)
Total Protein: 6.7 g/dL (ref 6.0–8.3)

## 2011-06-18 LAB — DIFFERENTIAL
Eosinophils Relative: 3 % (ref 0–5)
Lymphocytes Relative: 31 % (ref 12–46)
Lymphs Abs: 2 10*3/uL (ref 0.7–4.0)
Monocytes Absolute: 0.6 10*3/uL (ref 0.1–1.0)
Monocytes Relative: 9 % (ref 3–12)

## 2011-06-18 LAB — SURGICAL PCR SCREEN
MRSA, PCR: NEGATIVE
Staphylococcus aureus: NEGATIVE

## 2011-06-18 LAB — BASIC METABOLIC PANEL
Calcium: 8.2 mg/dL — ABNORMAL LOW (ref 8.4–10.5)
GFR calc Af Amer: >90 mL/min (ref >90)
GFR calc non Af Amer: >90 mL/min (ref >90)
Sodium: 140 mEq/L (ref 135–145)

## 2011-06-18 LAB — PROTIME-INR
Prothrombin Time: 13.1 seconds (ref 11.6–15.2)
Prothrombin Time: 14.1 seconds (ref 11.6–15.2)

## 2011-06-18 SURGERY — ENDOVASCULAR STENT GRAFT INSERTION
Anesthesia: General | Wound class: Clean

## 2011-06-18 MED ORDER — GLYCOPYRROLATE 0.2 MG/ML IJ SOLN
INTRAMUSCULAR | Status: DC | PRN
  Administered 2011-06-18: .8 mg via INTRAVENOUS
  Administered 2011-06-18: 0.2 mg via INTRAVENOUS

## 2011-06-18 MED ORDER — GUAIFENESIN-DM 100-10 MG/5ML PO SYRP: 15.0000 mL | ORAL_SOLUTION | ORAL | Status: DC | PRN

## 2011-06-18 MED ORDER — MIDAZOLAM HCL 5 MG/5ML IJ SOLN
INTRAMUSCULAR | Status: DC | PRN
  Administered 2011-06-18 (×2): 2 mg via INTRAVENOUS

## 2011-06-18 MED ORDER — PROPOFOL 10 MG/ML IV EMUL
INTRAVENOUS | Status: DC | PRN
  Administered 2011-06-18 (×2): 50 mg via INTRAVENOUS
  Administered 2011-06-18: 200 mg via INTRAVENOUS

## 2011-06-18 MED ORDER — ACETAMINOPHEN 325 MG PO TABS: 325.0000 mg | ORAL_TABLET | ORAL | Status: DC | PRN

## 2011-06-18 MED ORDER — FAMOTIDINE IN NACL 20-0.9 MG/50ML-% IV SOLN
20.0000 mg | Freq: Two times a day (BID) | INTRAVENOUS | Status: DC
  Administered 2011-06-18: 20 mg via INTRAVENOUS
  Filled 2011-06-18 (×3): qty 50

## 2011-06-18 MED ORDER — SODIUM CHLORIDE 0.9 % IR SOLN
Status: DC | PRN
  Administered 2011-06-18: 12:00:00

## 2011-06-18 MED ORDER — LABETALOL HCL 5 MG/ML IV SOLN: 10.0000 mg | INTRAVENOUS | Status: DC | PRN

## 2011-06-18 MED ORDER — DOPAMINE-DEXTROSE 3.2-5 MG/ML-% IV SOLN: 3.0000 ug/kg/min | INTRAVENOUS | Status: DC

## 2011-06-18 MED ORDER — ACETAMINOPHEN 650 MG RE SUPP: 325.0000 mg | RECTAL | Status: DC | PRN

## 2011-06-18 MED ORDER — DOCUSATE SODIUM 100 MG PO CAPS: 100.0000 mg | ORAL_CAPSULE | Freq: Every day | ORAL | Status: DC

## 2011-06-18 MED ORDER — PHENOL 1.4 % MT LIQD: 1.0000 | OROMUCOSAL | Status: DC | PRN

## 2011-06-18 MED ORDER — HYDROMORPHONE HCL PF 1 MG/ML IJ SOLN
0.2500 mg | INTRAMUSCULAR | Status: DC | PRN
  Administered 2011-06-18 (×4): 0.5 mg via INTRAVENOUS

## 2011-06-18 MED ORDER — POTASSIUM CHLORIDE CRYS ER 20 MEQ PO TBCR: 20.0000 meq | EXTENDED_RELEASE_TABLET | Freq: Once | ORAL | Status: AC | PRN

## 2011-06-18 MED ORDER — LACTATED RINGERS IV SOLN
INTRAVENOUS | Status: DC
  Administered 2011-06-18: 12:00:00 via INTRAVENOUS

## 2011-06-18 MED ORDER — BISACODYL 5 MG PO TBEC: 5.0000 mg | DELAYED_RELEASE_TABLET | Freq: Every day | ORAL | Status: DC | PRN

## 2011-06-18 MED ORDER — MUPIROCIN 2 % EX OINT
TOPICAL_OINTMENT | CUTANEOUS | Status: AC
  Administered 2011-06-18: 1 via NASAL
  Filled 2011-06-18: qty 22

## 2011-06-18 MED ORDER — ARTIFICIAL TEARS OP OINT
TOPICAL_OINTMENT | OPHTHALMIC | Status: DC | PRN
  Administered 2011-06-18: 1 via OPHTHALMIC

## 2011-06-18 MED ORDER — MORPHINE SULFATE 2 MG/ML IJ SOLN
2.0000 mg | INTRAMUSCULAR | Status: DC | PRN
  Administered 2011-06-18 – 2011-06-19 (×5): 4 mg via INTRAVENOUS
  Filled 2011-06-18 (×3): qty 2

## 2011-06-18 MED ORDER — METOPROLOL TARTRATE 1 MG/ML IV SOLN: 2.0000 mg | INTRAVENOUS | Status: DC | PRN

## 2011-06-18 MED ORDER — ONDANSETRON HCL 4 MG/2ML IJ SOLN: 4.0000 mg | Freq: Four times a day (QID) | INTRAMUSCULAR | Status: DC | PRN

## 2011-06-18 MED ORDER — SODIUM CHLORIDE 0.9 % IV SOLN: 500.0000 mL | Freq: Once | INTRAVENOUS | Status: AC | PRN

## 2011-06-18 MED ORDER — 0.9 % SODIUM CHLORIDE (POUR BTL) OPTIME
TOPICAL | Status: DC | PRN
  Administered 2011-06-18: 1000 mL

## 2011-06-18 MED ORDER — ROCURONIUM BROMIDE 100 MG/10ML IV SOLN
INTRAVENOUS | Status: DC | PRN
  Administered 2011-06-18 (×2): 50 mg via INTRAVENOUS

## 2011-06-18 MED ORDER — MUPIROCIN 2 % EX OINT
TOPICAL_OINTMENT | Freq: Two times a day (BID) | CUTANEOUS | Status: DC
  Administered 2011-06-18: 23:00:00 via NASAL
  Administered 2011-06-18: 1 via NASAL

## 2011-06-18 MED ORDER — ALPRAZOLAM 0.5 MG PO TABS
1.0000 mg | ORAL_TABLET | Freq: Once | ORAL | Status: AC
  Administered 2011-06-18: 1 mg via ORAL
  Filled 2011-06-18: qty 2

## 2011-06-18 MED ORDER — FENTANYL CITRATE 0.05 MG/ML IJ SOLN
INTRAMUSCULAR | Status: DC | PRN
  Administered 2011-06-18 (×2): 50 ug via INTRAVENOUS
  Administered 2011-06-18: 100 ug via INTRAVENOUS
  Administered 2011-06-18 (×2): 50 ug via INTRAVENOUS

## 2011-06-18 MED ORDER — LACTATED RINGERS IV SOLN
INTRAVENOUS | Status: DC | PRN
  Administered 2011-06-18: 12:00:00 via INTRAVENOUS

## 2011-06-18 MED ORDER — LACTATED RINGERS IV SOLN
INTRAVENOUS | Status: DC | PRN
  Administered 2011-06-18 (×2): via INTRAVENOUS

## 2011-06-18 MED ORDER — EPHEDRINE SULFATE 50 MG/ML IJ SOLN
INTRAMUSCULAR | Status: DC | PRN
  Administered 2011-06-18 (×2): 5 mg via INTRAVENOUS

## 2011-06-18 MED ORDER — ONDANSETRON HCL 4 MG/2ML IJ SOLN
INTRAMUSCULAR | Status: DC | PRN
  Administered 2011-06-18: 4 mg via INTRAVENOUS

## 2011-06-18 MED ORDER — SODIUM CHLORIDE 0.9 % IV SOLN
INTRAVENOUS | Status: DC
  Administered 2011-06-18 (×2): via INTRAVENOUS

## 2011-06-18 MED ORDER — VECURONIUM BROMIDE 10 MG IV SOLR
INTRAVENOUS | Status: DC | PRN
  Administered 2011-06-18: 1 mg via INTRAVENOUS

## 2011-06-18 MED ORDER — ASPIRIN EC 81 MG PO TBEC
81.0000 mg | DELAYED_RELEASE_TABLET | ORAL | Status: DC
  Filled 2011-06-18: qty 1

## 2011-06-18 MED ORDER — IODIXANOL 320 MG/ML IV SOLN
INTRAVENOUS | Status: DC | PRN
  Administered 2011-06-18: 85.5 mL via INTRA_ARTERIAL

## 2011-06-18 MED ORDER — ONDANSETRON HCL 4 MG/2ML IJ SOLN: 4.0000 mg | Freq: Once | INTRAMUSCULAR | Status: DC | PRN

## 2011-06-18 MED ORDER — SENNOSIDES-DOCUSATE SODIUM 8.6-50 MG PO TABS
1.0000 | ORAL_TABLET | Freq: Every evening | ORAL | Status: DC | PRN
  Filled 2011-06-18: qty 1

## 2011-06-18 MED ORDER — HEPARIN SODIUM (PORCINE) 1000 UNIT/ML IJ SOLN
INTRAMUSCULAR | Status: DC | PRN
  Administered 2011-06-18: 6000 [IU] via INTRAVENOUS

## 2011-06-18 MED ORDER — OXYCODONE HCL 5 MG PO TABS
5.0000 mg | ORAL_TABLET | ORAL | Status: DC | PRN
  Administered 2011-06-18: 10 mg via ORAL
  Administered 2011-06-18 (×2): 5 mg via ORAL
  Administered 2011-06-19: 10 mg via ORAL
  Filled 2011-06-18 (×2): qty 2

## 2011-06-18 MED ORDER — HYDRALAZINE HCL 20 MG/ML IJ SOLN
10.0000 mg | INTRAMUSCULAR | Status: DC | PRN
  Filled 2011-06-18: qty 0.5

## 2011-06-18 MED ORDER — FLEET ENEMA 7-19 GM/118ML RE ENEM: 1.0000 | ENEMA | Freq: Once | RECTAL | Status: AC | PRN

## 2011-06-18 MED ORDER — MAGNESIUM SULFATE 40 MG/ML IJ SOLN
2.0000 g | Freq: Once | INTRAMUSCULAR | Status: AC | PRN
  Filled 2011-06-18: qty 50

## 2011-06-18 MED ORDER — NEOSTIGMINE METHYLSULFATE 1 MG/ML IJ SOLN
INTRAMUSCULAR | Status: DC | PRN
  Administered 2011-06-18: 5 mg via INTRAVENOUS

## 2011-06-18 MED ORDER — DEXTROSE 5 % IV SOLN
10.0000 mg | INTRAVENOUS | Status: DC | PRN
  Administered 2011-06-18: 10 ug/min via INTRAVENOUS

## 2011-06-18 MED ORDER — DEXTROSE 5 % IV SOLN
1.5000 g | Freq: Two times a day (BID) | INTRAVENOUS | Status: DC
  Administered 2011-06-18: 1.5 g via INTRAVENOUS
  Filled 2011-06-18 (×2): qty 1.5

## 2011-06-18 SURGICAL SUPPLY — 68 items
BAG SNAP BAND KOVER 36X36 (MISCELLANEOUS) ×2 IMPLANT
BALLN CODA OCL 2-9.0-35-120-3 (BALLOONS)
BALLOON COD OCL 2-9.0-35-120-3 (BALLOONS) IMPLANT
CANISTER SUCTION 2500CC (MISCELLANEOUS) ×2 IMPLANT
CATH BEACON 5.038 65CM KMP-01 (CATHETERS) ×2 IMPLANT
CATH OMNI FLUSH .035X70CM (CATHETERS) ×2 IMPLANT
CLIP TI MEDIUM 24 (CLIP) IMPLANT
CLIP TI WIDE RED SMALL 24 (CLIP) IMPLANT
CLOTH BEACON ORANGE TIMEOUT ST (SAFETY) ×2 IMPLANT
COVER MAYO STAND STRL (DRAPES) ×2 IMPLANT
COVER PROBE W GEL 5X96 (DRAPES) ×2 IMPLANT
COVER SURGICAL LIGHT HANDLE (MISCELLANEOUS) ×4 IMPLANT
DERMABOND ADHESIVE PROPEN (GAUZE/BANDAGES/DRESSINGS) ×2
DERMABOND ADVANCED (GAUZE/BANDAGES/DRESSINGS) ×1
DERMABOND ADVANCED .7 DNX12 (GAUZE/BANDAGES/DRESSINGS) ×1 IMPLANT
DERMABOND ADVANCED .7 DNX6 (GAUZE/BANDAGES/DRESSINGS) ×2 IMPLANT
DEVICE CLOSURE PERCLS PRGLD 6F (VASCULAR PRODUCTS) ×4 IMPLANT
DRAIN CHANNEL 10F 3/8 F FF (DRAIN) IMPLANT
DRAIN CHANNEL 10M FLAT 3/4 FLT (DRAIN) IMPLANT
DRAPE C-ARM 42X72 X-RAY (DRAPES) ×2 IMPLANT
DRAPE TABLE COVER HEAVY DUTY (DRAPES) ×2 IMPLANT
DRSG TEGADERM 2-3/8X2-3/4 SM (GAUZE/BANDAGES/DRESSINGS) ×4 IMPLANT
ELECT CAUTERY BLADE 6.4 (BLADE) IMPLANT
ELECT REM PT RETURN 9FT ADLT (ELECTROSURGICAL) ×4
ELECTRODE REM PT RTRN 9FT ADLT (ELECTROSURGICAL) ×2 IMPLANT
EVACUATOR 3/16  PVC DRAIN (DRAIN)
EVACUATOR 3/16 PVC DRAIN (DRAIN) IMPLANT
EVACUATOR SILICONE 100CC (DRAIN) IMPLANT
EXCLUDER TRUNK (Endovascular Graft) ×2 IMPLANT
GLOVE SS BIOGEL STRL SZ 7 (GLOVE) ×1 IMPLANT
GLOVE SUPERSENSE BIOGEL SZ 7 (GLOVE) ×1
GOWN STRL NON-REIN LRG LVL3 (GOWN DISPOSABLE) ×8 IMPLANT
GRAFT BALLN CATH 65CM (STENTS) ×1 IMPLANT
GRAFT EXCLUDER LEG (Endovascular Graft) ×4 IMPLANT
GUIDEWIRE AMPLATZ STIFF 0.35 (WIRE) ×4 IMPLANT
KIT BASIN OR (CUSTOM PROCEDURE TRAY) ×2 IMPLANT
KIT ROOM TURNOVER OR (KITS) ×2 IMPLANT
NEEDLE PERC 18GX7CM (NEEDLE) ×2 IMPLANT
NS IRRIG 1000ML POUR BTL (IV SOLUTION) ×4 IMPLANT
PACK AORTA (CUSTOM PROCEDURE TRAY) ×2 IMPLANT
PAD ARMBOARD 7.5X6 YLW CONV (MISCELLANEOUS) ×4 IMPLANT
PENCIL BUTTON HOLSTER BLD 10FT (ELECTRODE) IMPLANT
PERCLOSE PROGLIDE 6F (VASCULAR PRODUCTS) ×8
SHEATH AVANTI 11CM 8FR (MISCELLANEOUS) ×2 IMPLANT
SHEATH BRITE TIP 8FR 23CM (MISCELLANEOUS) ×2 IMPLANT
SHEATH DRY SEAL 18FRX28CM (SHEATH) ×4 IMPLANT
STENT GRAFT BALLN CATH 65CM (STENTS) ×1
STOPCOCK MORSE 400PSI 3WAY (MISCELLANEOUS) ×2 IMPLANT
SUT ETHILON 3 0 PS 1 (SUTURE) IMPLANT
SUT PROLENE 5 0 C 1 24 (SUTURE) IMPLANT
SUT PROLENE 5 0 CC 1 (SUTURE) IMPLANT
SUT PROLENE 6 0 C 1 30 (SUTURE) IMPLANT
SUT VIC AB 2-0 CT1 27 (SUTURE)
SUT VIC AB 2-0 CT1 TAPERPNT 27 (SUTURE) IMPLANT
SUT VIC AB 3-0 SH 27 (SUTURE)
SUT VIC AB 3-0 SH 27X BRD (SUTURE) IMPLANT
SUT VICRYL 4-0 PS2 18IN ABS (SUTURE) ×4 IMPLANT
SYR 20CC LL (SYRINGE) ×4 IMPLANT
SYR 30ML LL (SYRINGE) IMPLANT
SYR 5ML LL (SYRINGE) IMPLANT
SYR MEDRAD MARK V 150ML (SYRINGE) ×2 IMPLANT
SYRINGE 10CC LL (SYRINGE) ×4 IMPLANT
TOWEL OR 17X24 6PK STRL BLUE (TOWEL DISPOSABLE) ×4 IMPLANT
TOWEL OR 17X26 10 PK STRL BLUE (TOWEL DISPOSABLE) ×4 IMPLANT
TRAY FOLEY CATH 14FRSI W/METER (CATHETERS) ×2 IMPLANT
TUBING HIGH PRESSURE 120CM (CONNECTOR) ×2 IMPLANT
WATER STERILE IRR 1000ML POUR (IV SOLUTION) ×2 IMPLANT
WIRE BENTSON .035X145CM (WIRE) ×4 IMPLANT

## 2011-06-18 NOTE — Anesthesia Procedure Notes (Signed)
Procedure Name: Intubation Date/Time: 06/18/2011 12:51 PM Performed by: Adria Dill Pre-anesthesia Checklist: Patient identified Patient Re-evaluated:Patient Re-evaluated prior to inductionOxygen Delivery Method: Circle System Utilized Preoxygenation: Pre-oxygenation with 100% oxygen Intubation Type: IV induction Ventilation: Mask ventilation without difficulty Grade View: Grade I Nasal Tubes: Right, Nasal prep performed and Nasal Rae Tube size: 7.5 mm Number of attempts: 2 Airway Equipment and Method: fiberoptic brochoscope Placement Confirmation: ETT inserted through vocal cords under direct vision,  positive ETCO2 and breath sounds checked- equal and bilateral Tube secured with: Tape Dental Injury: Teeth and Oropharynx as per pre-operative assessment  Comments: Pt electively intubated with nasal fiberoptic approach r/t recent dental damage to 6 front upper teeth by anesthesia at outside facility. Pt aware of plan and risk of dental damage.

## 2011-06-18 NOTE — Anesthesia Postprocedure Evaluation (Signed)
  Anesthesia Post-op Note  Patient: Joshua Bailey  Procedure(s) Performed:  ENDOVASCULAR STENT GRAFT INSERTION  Patient Location: PACU  Anesthesia Type: General  Level of Consciousness: awake and alert   Airway and Oxygen Therapy: Patient Spontanous Breathing and Patient connected to nasal cannula oxygen  Post-op Pain: mild  Post-op Assessment: Post-op Vital signs reviewed, Patient's Cardiovascular Status Stable, Respiratory Function Stable, Patent Airway, No signs of Nausea or vomiting and Pain level controlled  Post-op Vital Signs: Reviewed and stable  Complications: No apparent anesthesia complications

## 2011-06-18 NOTE — Op Note (Signed)
[facility] [place]  Patient name: Joshua Bailey MRN: [id number] DOB: 1956/04/06 Sex: male  06/18/2011 Pre-operative Diagnosis: AAA Post-operative diagnosis:  Same Surgeon:  Jorge Ny Procedure:  EVAR Anesthesia: Gen Blood Loss:  See anesthesia record Specimens:  none  Findings:  Complete exclusion  I  Procedure:  The patient was identified in the holding area and taken to [facility] OR ROOM 09  The patient was then placed supine on the table. general anesthesia was administered.  The patient was prepped and draped in the usual sterile fashion.  A time out was called and antibiotics were administered.  Please ee Dr. Candie Chroman note for full details of the procedure.  I helped with access and closure, deployment of the devices and closure of both groins.   Disposition:  To PACU in stable condition.   Juleen China, M.D. [facility] [place] Office: [phone] Pager:  [phone]

## 2011-06-18 NOTE — Op Note (Signed)
OPERATIVE REPORT  Date of Surgery: 06/18/2011  Surgeon: Josephina Gip, MD  Assistant: Dr. Durene Cal  Pre-op Diagnosis: AAA  Post-op Diagnosis: AAA  Procedure: Procedure(s): ENDOVASCULAR STENT GRAFT INSERTION-GORE -Excluder aorto by common iliac #1 main body right-23 x 14 x 12 cm #2 right leg extension using 20 mm x 11.5 centimeter limb #3 contralateral well and left-18 x 9.5 cm Bilateral percutaneous access common femoral arteries using ultrasound guidance with repair bilateral femoral arteries using Perclose-provide device Completion angiography Anesthesia: Nasotracheal-Gen.  EBL: 200 cc  Complications: None  Procedure Details: Patient was taken to the operating room placed in supine position at which time satisfactory nasotracheal general anesthesia was administered. Abdomen and groins were prepped with Betadine scrub and solution draped in routine sterile manner. Bilateral common femoral artery access was obtained percutaneously. Punctures were performed with ultrasound guidance guidewires passed into the distal aorta and after dilating the tract to Perclose-proglyde sutures were placed in each femoral artery one at the 11:00 position one at the 1:00 position. Short 8 French sheath was inserted on the right side and a long 8 [place] sheath on the left side. The tail catheter was passed up the left side an angiogram was performed using 20 cc of contrast at 20 cc per second. This allowed precisel localization of the renal arteries. The 8 French sheath was removed on the right and a 18 French sheath was passed over an Amplatz wire and positioned just distal to the lowest presses right) renal artery. There was a very long infrarenal neck measuring 40 mm in length. A 23 x 14 x 12 cm graft was utilized and positioned appropriately. The sheath was withdrawn and the body of the graft was deployed down to the contralateral gate which was located anteriorly and slightly to the right. Following  this using the 8 French sheath and a Kumpe catheter the contralateral gate was cannulated guidewires exchanged and retrograde shuntogram was performed to measure the appropriate length for the contralateral limb. An a 20 x 11.5 cm graft was selected and the plate appropriately into the distal left common iliac artery which was aneurysmal. Following this the remainder of the right limb was deployed and a retrograde shuntogram was performed to precisely locate the origin of the internal iliac and for measuring purposes. A 18 x 9 point centimeter extension was utilized on the right side to reach the distal right common iliac artery. After deployment of this all junctions were dilated with the Q 50 balloon. Completion angiography was then performed injecting 20 cc of contrast 20 cc per second. This revealed no evidence of endoleak with excellent location of the distal limbs bilaterally with good filling of both hypogastrics. Following this the sheaths were removed sequentially from the femoral arteries and the closure plate repaired using the pro glide sutures. Adequate hemostasis was achieved protamine was given to reverse the heparin which was administered at the beginning of the case after insertion of the femoral sheaths. 6000 units of heparin was given and it was reversed with 50 mg of protamine. Both wounds were closed with single 3-0 Prolene sutures and Dermabond patient taken to the recovery room in stable condition.   Josephina Gip, MD 06/18/2011 2:44 PM

## 2011-06-18 NOTE — Transfer of Care (Signed)
Immediate Anesthesia Transfer of Care Note  Patient: Joshua Bailey  Procedure(s) Performed:  ENDOVASCULAR STENT GRAFT INSERTION  Patient Location: PACU  Anesthesia Type: General  Level of Consciousness: awake, alert , oriented and patient cooperative  Airway & Oxygen Therapy: Patient Spontanous Breathing and Patient connected to nasal cannula oxygen  Post-op Assessment: Report given to PACU RN, Post -op Vital signs reviewed and stable and Patient moving all extremities X 4  Post vital signs: Reviewed and stable  Complications: No apparent anesthesia complications

## 2011-06-18 NOTE — Interval H&P Note (Signed)
History and Physical Interval Note:  06/18/2011 12:18 PM  Joshua Bailey  has presented today for surgery, with the diagnosis of AAA  The various methods of treatment have been discussed with the patient and family. After consideration of risks, benefits and other options for treatment, the patient has consented to  Procedure(s): ENDOVASCULAR STENT GRAFT INSERTION as a surgical intervention .  The patients' history has been reviewed, patient examined, no change in status, stable for surgery.  I have reviewed the patients' chart and labs.  Questions were answered to the patient's satisfaction.     Leandro Berkowitz D

## 2011-06-18 NOTE — Preoperative (Signed)
Beta Blockers   Reason not to administer Beta Blockers:Not Applicable 

## 2011-06-18 NOTE — Progress Notes (Signed)
BMET; PT/PTT, CBC, INR DRAWN AND SENT TO LAB.

## 2011-06-18 NOTE — H&P (View-Only) (Signed)
Patient ID: Joshua Bailey, male   DOB: [birth year], 56 y.o.   MRN: [id number] Vascular Surgery Progress Note  Subjective: None AAA admitted with short duration syncopal episode  Objective:  Filed Vitals:   06/11/11 1200  BP: 152/89  Pulse: 55  Temp: 98.7 F (37.1 C)  Resp: 20    Patient has had a few short duration syncopal episodes and is being evaluated for this during this hospitalization. He was in the process of being evaluated for an aortic aneurysm stent graft repair. He had a CT angiogram today which I have reviewed. He is a good candidate for aortic stent grafting. Aneurysm now to 6 cm in maximum diameter. He had a Cardiolite performed which is a low risk study.   Labs:  Lab 06/11/11 0537 06/11/11 0118 06/10/11 1640  CREATININE 0.84 0.87 1.00    Lab 06/11/11 0537 06/11/11 0118 06/10/11 1640  NA 145 -- 143  K 4.0 -- 4.2  CL 110 -- 105  CO2 27 -- --  BUN 15 -- 14  CREATININE 0.84 0.87 1.00  LABGLOM -- -- --  GLUCOSE 89 -- --  CALCIUM 8.7 -- --    Lab 06/11/11 0537 06/10/11 1640 06/10/11 1611  WBC 4.5 -- 5.2  HGB 13.9 15.6 15.1  HCT 41.5 46.0 45.0  PLT 145* -- 147*    Lab 06/10/11 1611  INR 0.96    I/O last 3 completed shifts: In: -  Out: 625 [Urine:625]  Imaging: Ct Head Wo Contrast  06/10/2011  *RADIOLOGY REPORT*  Clinical Data: Dizziness, syncope  CT HEAD WITHOUT CONTRAST  Technique:  Contiguous axial images were obtained from the base of the skull through the vertex without contrast.  Comparison: None.  Findings: No evidence of parenchymal hemorrhage or extra-axial fluid collection. No mass lesion, mass effect, or midline shift.  No CT evidence of acute infarction.  Cerebral volume is age appropriate.  No ventriculomegaly.  The visualized paranasal sinuses are essentially clear. The mastoid air cells are unopacified.  No evidence of calvarial fracture.  IMPRESSION: Normal head CT.  Original Report Authenticated By: Charline Bills, M.D.   Ct Angio  Chest W/cm &/or Wo Cm  06/10/2011  *RADIOLOGY REPORT*  Clinical Data:  56 year old male with syncope and chest, abdominal and pelvic pain.  Known abdominal aortic aneurysm.  CT ANGIOGRAPHY CHEST, ABDOMEN AND PELVIS  Technique:  Multidetector CT imaging through the chest, abdomen and pelvis was performed using the standard protocol during bolus administration of intravenous contrast.  Multiplanar reconstructed images including MIPs were obtained and reviewed to evaluate the vascular anatomy.  Contrast: OMNIPAQUE IOHEXOL 350 MG/ML IV SOLN  Comparison:  None  CTA CHEST  Findings:  There is no evidence of thoracic aortic aneurysm or dissection. The heart and great vessels are unremarkable except for minimal coronary artery calcifications. There is no evidence of pleural or pericardial effusion. No enlarged lymph nodes are identified.  The lungs are clear. There is no evidence of airspace opacity, consolidation, nodules/mass or endobronchial/endotracheal lesions. No acute or suspicious bony abnormalities are noted.  Mild circumferential wall thickening of the visualized esophagus may represent esophagitis.  Review of the MIP images confirms the above findings.  IMPRESSION: No evidence of thoracic aortic aneurysm or dissection.  Mild circumferential esophageal wall thickening - esophagitis not excluded.  CTA ABDOMEN AND PELVIS  Findings:  A 5.2 x 6 cm infrarenal abdominal aortic aneurysm is noted extending to the bifurcation with a length of 7 cm. Aneurysms of the common  iliac arteries are also identified with the right measuring 1.5 cm and the left measuring 1.7 cm. There is no evidence of vascular dissection or aneurysm rupture/leak.  The liver, gallbladder, spleen, adrenal glands, and pancreas are unremarkable.  Multiple bilateral nonobstructing renal calculi are noted as well as mild to moderate to renal cortical thinning.  No free fluid, enlarged lymph nodes or biliary dilatation identified. The bowel and  bladder are within normal limits. Prostate enlargement is present. No acute or suspicious bony abnormalities are identified.  Moderate degenerative changes in the lower lumbar spine identified.  Review of the MIP images confirms the above findings.  IMPRESSION: No evidence of acute abnormality.  5.2 x 6 cm infrarenal abdominal aortic aneurysm without dissection or leak/rupture.  Bilateral common iliac artery aneurysms.  Nonobstructing bilateral renal calculi  Prostate enlargement.  Original Report Authenticated By: Rosendo Gros, M.D.   Ct Cta Abd/pel W/cm &/or W/o Cm  06/10/2011  *RADIOLOGY REPORT*  Clinical Data:  56 year old male with syncope and chest, abdominal and pelvic pain.  Known abdominal aortic aneurysm.  CT ANGIOGRAPHY CHEST, ABDOMEN AND PELVIS  Technique:  Multidetector CT imaging through the chest, abdomen and pelvis was performed using the standard protocol during bolus administration of intravenous contrast.  Multiplanar reconstructed images including MIPs were obtained and reviewed to evaluate the vascular anatomy.  Contrast: OMNIPAQUE IOHEXOL 350 MG/ML IV SOLN  Comparison:  None  CTA CHEST  Findings:  There is no evidence of thoracic aortic aneurysm or dissection. The heart and great vessels are unremarkable except for minimal coronary artery calcifications. There is no evidence of pleural or pericardial effusion. No enlarged lymph nodes are identified.  The lungs are clear. There is no evidence of airspace opacity, consolidation, nodules/mass or endobronchial/endotracheal lesions. No acute or suspicious bony abnormalities are noted.  Mild circumferential wall thickening of the visualized esophagus may represent esophagitis.  Review of the MIP images confirms the above findings.  IMPRESSION: No evidence of thoracic aortic aneurysm or dissection.  Mild circumferential esophageal wall thickening - esophagitis not excluded.  CTA ABDOMEN AND PELVIS  Findings:  A 5.2 x 6 cm infrarenal  abdominal aortic aneurysm is noted extending to the bifurcation with a length of 7 cm. Aneurysms of the common iliac arteries are also identified with the right measuring 1.5 cm and the left measuring 1.7 cm. There is no evidence of vascular dissection or aneurysm rupture/leak.  The liver, gallbladder, spleen, adrenal glands, and pancreas are unremarkable.  Multiple bilateral nonobstructing renal calculi are noted as well as mild to moderate to renal cortical thinning.  No free fluid, enlarged lymph nodes or biliary dilatation identified. The bowel and bladder are within normal limits. Prostate enlargement is present. No acute or suspicious bony abnormalities are identified.  Moderate degenerative changes in the lower lumbar spine identified.  Review of the MIP images confirms the above findings.  IMPRESSION: No evidence of acute abnormality.  5.2 x 6 cm infrarenal abdominal aortic aneurysm without dissection or leak/rupture.  Bilateral common iliac artery aneurysms.  Nonobstructing bilateral renal calculi  Prostate enlargement.  Original Report Authenticated By: Rosendo Gros, M.D.    Assessment/Plan:    LOS: 1 day  s/p   Would continue workup for his syncopal episodes as you are doing. I am scheduling his aortic stent graft repair for Friday, February 25. He can be discharged following his current evaluation. I have discussed this with him and will notify him of the date of proposed surgery.  Josephina Gip, MD 06/11/2011 1:07 PM

## 2011-06-18 NOTE — Anesthesia Preprocedure Evaluation (Addendum)
Anesthesia Evaluation  Patient identified by MRN, date of birth, ID band Patient awake    Reviewed: Allergy & Precautions, H&P , NPO status , Patient's Chart, lab work & pertinent test results  Airway Mallampati: II TM Distance: >3 FB Neck ROM: Full    Dental  (+) Loose and Dental Advisory Given,    Pulmonary Current Smoker,          Cardiovascular Exercise Tolerance: Good     Neuro/Psych CVA, No Residual Symptoms Negative Psych ROS   GI/Hepatic negative GI ROS, Neg liver ROS,   Endo/Other  Negative Endocrine ROS  Renal/GU negative Renal ROS  Genitourinary negative   Musculoskeletal  (+) Arthritis -, Osteoarthritis,    Abdominal   Peds negative pediatric ROS (+)  Hematology negative hematology ROS (+)   Anesthesia Other Findings   Reproductive/Obstetrics negative OB ROS                          Anesthesia Physical Anesthesia Plan  ASA: III  Anesthesia Plan: General   Post-op Pain Management:    Induction: Intravenous  Airway Management Planned:   Additional Equipment: Arterial line  Intra-op Plan:   Post-operative Plan: Extubation in OR  Informed Consent: I have reviewed the patients History and Physical, chart, labs and discussed the procedure including the risks, benefits and alternatives for the proposed anesthesia with the patient or authorized representative who has indicated his/her understanding and acceptance.     Plan Discussed with: CRNA and Surgeon  Anesthesia Plan Comments:         Anesthesia Quick Evaluation

## 2011-06-19 ENCOUNTER — Other Ambulatory Visit: Payer: Self-pay | Admitting: Physician Assistant

## 2011-06-19 DIAGNOSIS — I714 Abdominal aortic aneurysm, without rupture: Secondary | ICD-10-CM

## 2011-06-19 DIAGNOSIS — Z8679 Personal history of other diseases of the circulatory system: Secondary | ICD-10-CM

## 2011-06-19 LAB — CBC
MCH: 31.3 pg (ref 26.0–34.0)
MCHC: 33.4 g/dL (ref 30.0–36.0)
Platelets: 140 10*3/uL — ABNORMAL LOW (ref 150–400)
RDW: 13.2 % (ref 11.5–15.5)

## 2011-06-19 LAB — BASIC METABOLIC PANEL WITH GFR
BUN: 13 mg/dL (ref 6–23)
CO2: 30 meq/L (ref 19–32)
Calcium: 8.4 mg/dL (ref 8.4–10.5)
Chloride: 107 meq/L (ref 96–112)
Creatinine, Ser: 0.97 mg/dL (ref 0.50–1.35)
GFR calc Af Amer: >90 mL/min
GFR calc non Af Amer: >90 mL/min
Glucose, Bld: 85 mg/dL (ref 70–99)
Potassium: 4 meq/L (ref 3.5–5.1)
Sodium: 143 meq/L (ref 135–145)

## 2011-06-19 MED ORDER — HYDROCODONE-ACETAMINOPHEN 10-325 MG PO TABS: 1.0000 | ORAL_TABLET | Freq: Four times a day (QID) | ORAL | Status: DC | PRN

## 2011-06-19 NOTE — Progress Notes (Addendum)
[facility] Progress Note  06/19/2011 7:50 AM POD 1  Only complaint is right groin, sharp pain.  Pt has ambulated.  Denies N/V.  Afebrile x 24hrs  HR 50-60s reg 100%2LO2NC Filed Vitals:   06/19/11 0359  BP: 111/62  Pulse: 62  Temp: 98.3 F (36.8 C)  Resp: 15    Incisions:  Bilateral groins soft without hematoma. Abdomen:  Soft NT/ND +BS; non-tender area with palpation where he states he is having pain. Extremities:  Warm and well perfused.  +palpable pedal pulses. Lungs:  CTAB Cardiac:  RRR  CBC    Component Value Date/Time   WBC 6.9 06/19/2011 0600   RBC 4.03* 06/19/2011 0600   HGB 12.6* 06/19/2011 0600   HCT 37.7* 06/19/2011 0600   PLT 140* 06/19/2011 0600   MCV 93.5 06/19/2011 0600   MCH 31.3 06/19/2011 0600   MCHC 33.4 06/19/2011 0600   RDW 13.2 06/19/2011 0600   LYMPHSABS 2.0 06/18/2011 0816   MONOABS 0.6 06/18/2011 0816   EOSABS 0.2 06/18/2011 0816   BASOSABS 0.0 06/18/2011 0816    BMET    Component Value Date/Time   NA 143 06/19/2011 0600   K 4.0 06/19/2011 0600   CL 107 06/19/2011 0600   CO2 30 06/19/2011 0600   GLUCOSE 85 06/19/2011 0600   BUN 13 06/19/2011 0600   CREATININE 0.97 06/19/2011 0600   CALCIUM 8.4 06/19/2011 0600   GFRNONAA >90 06/19/2011 0600   GFRAA >90 06/19/2011 0600    INR    Component Value Date/Time   INR 1.07 06/18/2011 1548     Intake/Output Summary (Last 24 hours) at 06/19/11 0750 Last data filed at 06/19/11 0559  Gross per 24 hour  Intake 4241.67 ml  Output   1625 ml  Net 2616.67 ml     Assessment/Plan:  56 y.o. male is s/p EVAR POD 1  -BUN/Cr normal with good UOP -acute surgical blood loss anemia--pt tolerating -foley out this am.  Pt needs to void. -has ambulated. -home later this am after he has voided.  Newton Pigg, PA-C [facility] [phone] 06/19/2011 7:50 AM    I have examined the patient, reviewed and agree with above.DC home with CT fu in 1 month  Athanasios Heldman F,  MD 06/19/2011 9:20 AM

## 2011-06-19 NOTE — Progress Notes (Signed)
Pt discharged home with wife, per MD order. All discharge instructions were reviewed and all questions answered. All pt belonging sent home with pt.

## 2011-06-19 NOTE — Discharge Summary (Signed)
[facility] Discharge Summary  SHRAVAN SALAHUDDIN 1955/09/26 56 y.o. male  [id number]  Admission Date: 06/18/2011  Discharge Date: 06/19/11  Physician: Pryor Ochoa, MD  Admission Diagnosis: AAA   HPI:   This is a 56 y.o. male who has had a few short duration syncopal episodes and is being evaluated for this during this hospitalization. He was in the process of being evaluated for an aortic aneurysm stent graft repair. He had a CT angiogram today which I have reviewed. He is a good candidate for aortic stent grafting. Aneurysm now to 6 cm in maximum diameter. He had a Cardiolite performed which is a low risk study.       Hospital Course:  The patient was admitted to the hospital and taken to the operating room on 06/18/2011 and underwent EVAR.  The pt tolerated the procedure well and was transported to the PACU in good condition. By POD 1, he has ambulated and is doing quite well.  He has + palpable pedal pulses bilaterally.  Abdomen is soft NT/ND and +BS. The remainder of the hospital course consisted of increasing ambulation and increasing intake of solids without difficulty.    Basename 06/19/11 0600 06/18/11 1548  NA 143 140  K 4.0 3.8  CL 107 107  CO2 30 24  GLUCOSE 85 86  BUN 13 14  CALCIUM 8.4 8.2*    Basename 06/19/11 0600 06/18/11 1548  WBC 6.9 5.9  HGB 12.6* 13.1  HCT 37.7* 38.3*  PLT 140* 130*    Basename 06/18/11 1548 06/18/11 0816  INR 1.07 0.97     Discharge Instructions:   The patient is discharged to home with extensive instructions on wound care and progressive ambulation.  They are instructed not to drive or perform any heavy lifting until returning to see the physician in his office.  Discharge Orders    Future Orders Please Complete By Expires   Resume previous diet      Driving Restrictions      Comments:   No driving for 2 weeks   Lifting restrictions      Comments:   No lifting for 6 weeks   Call MD for:   temperature >100.5      Call MD for:  redness, tenderness, or signs of infection (pain, swelling, bleeding, redness, odor or green/yellow discharge around incision site)      Call MD for:  severe or increased pain, loss or decreased feeling  in affected limb(s)      Remove dressing in 48 hours      Scheduling Instructions:   Remove dressing on Monday and shower daily with soap and water.   ABDOMINAL PROCEDURE/ANEURYSM REPAIR/AORTO-BIFEMORAL BYPASS:  Call MD for increased abdominal pain; cramping diarrhea; nausea/vomiting         Discharge Diagnosis:  AAA  Secondary Diagnosis: Patient Active Problem List  Diagnoses  . Abdominal aneurysm without mention of rupture  . Syncope  . Thrombocytopenia   Past Medical History  Diagnosis Date  . Stroke 2004  . Aortic aneurysm, abdominal   . History of nephrolithiasis 06/10/11    "have 2 right now"  . Complication of anesthesia 06/10/11    "had laser OR on teeth; can not put ETT down; must go thru nose"  . 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 Arthritis     "back and some in my hands"  .  Chronic lower back pain   . Chronic knee pain     right; "it never stops hurting"       Pankaj, Haack  Home Medication Instructions ZOX:[id number]   Printed on:06/19/11 0802  Medication Information                    ALPRAZolam (XANAX) 1 MG tablet Take 1 mg by mouth at bedtime as needed. To help sleep.           Omega-3 Fatty Acids (FISH OIL) 1200 MG CAPS Take 1,200 mg by mouth 2 (two) times daily.            cholecalciferol (VITAMIN D) 1000 UNITS tablet Take 2,000 Units by mouth daily.            ascorbic acid (VITAMIN C) 1000 MG tablet Take 1,000 mg by mouth daily.            aspirin EC 81 MG tablet Take 81 mg by mouth every other day.           HYDROcodone-acetaminophen (NORCO) 10-325 MG per tablet Take 1 tablet by mouth every 6 (six)  hours as needed for pain.             Disposition: home  Patient's condition: is Good  Follow up: 1. Dr. Hart Rochester 4 weeks with CTA   Newton Pigg, PA-C [facility] [phone] 06/19/2011  8:02 AM

## 2011-06-19 NOTE — Progress Notes (Signed)
2230 patient called out his a line was bleeding, applied pressure, removed aline, notified dr early pt felt light headed vs stable, no orders received will continue to monitor Joshua Bailey, [place]

## 2011-06-22 ENCOUNTER — Other Ambulatory Visit: Payer: Medicare Other

## 2011-06-22 ENCOUNTER — Ambulatory Visit: Payer: Medicare Other | Admitting: Vascular Surgery

## 2011-06-23 ENCOUNTER — Encounter (HOSPITAL_COMMUNITY): Payer: Self-pay | Admitting: Vascular Surgery

## 2011-06-29 ENCOUNTER — Ambulatory Visit: Payer: Medicare Other | Admitting: Vascular Surgery

## 2011-06-29 ENCOUNTER — Other Ambulatory Visit (HOSPITAL_COMMUNITY): Payer: Medicare Other

## 2011-07-26 ENCOUNTER — Encounter: Payer: Self-pay | Admitting: Vascular Surgery

## 2011-07-27 ENCOUNTER — Ambulatory Visit (INDEPENDENT_AMBULATORY_CARE_PROVIDER_SITE_OTHER): Payer: Medicare Other | Admitting: Vascular Surgery

## 2011-07-27 ENCOUNTER — Encounter: Payer: Self-pay | Admitting: Vascular Surgery

## 2011-07-27 ENCOUNTER — Ambulatory Visit (HOSPITAL_COMMUNITY)
Admission: RE | Admit: 2011-07-27 | Discharge: 2011-07-27 | Disposition: A | Discharge status: Home / Self Care | Payer: Medicare Other | Source: Ambulatory Visit | Attending: Physician Assistant | Admitting: Physician Assistant

## 2011-07-27 VITALS — BP 142/79 | HR 92 | Resp 20 | Ht 73.0 in | Wt 185.0 lb

## 2011-07-27 DIAGNOSIS — K5730 Diverticulosis of large intestine without perforation or abscess without bleeding: Secondary | ICD-10-CM | POA: Insufficient documentation

## 2011-07-27 DIAGNOSIS — I714 Abdominal aortic aneurysm, without rupture, unspecified: Secondary | ICD-10-CM | POA: Insufficient documentation

## 2011-07-27 DIAGNOSIS — K7689 Other specified diseases of liver: Secondary | ICD-10-CM | POA: Insufficient documentation

## 2011-07-27 DIAGNOSIS — N200 Calculus of kidney: Secondary | ICD-10-CM | POA: Insufficient documentation

## 2011-07-27 MED ORDER — IOHEXOL 350 MG/ML SOLN: 100.0000 mL | Freq: Once | INTRAVENOUS | Status: DC | PRN

## 2011-07-27 MED ORDER — IOHEXOL 350 MG/ML SOLN
100.0000 mL | Freq: Once | INTRAVENOUS | Status: AC | PRN
  Administered 2011-07-27: 100 mL via INTRAVENOUS

## 2011-07-27 NOTE — Progress Notes (Signed)
Subjective:     Patient ID: Joshua Bailey, male   DOB: 07/01/1955, 56 y.o.   MRN: [id number]  HPI this 56 year old male returns for initial followup regarding his aortic stent graft placed 06/18/2011 for an infrarenal abdominal aortic aneurysm. He has had no specific complaints other than generalized weakness since his discharge for the first 2 weeks but has been feeling good for the past 2 weeks. His appetite is good and his ambulation is increasing daily. Denies any abdominal or back pain.   Review of Systems     Objective:   Physical ExamBP 142/79  Pulse 92  Resp 20  Ht 6\' 1"  (1.854 m)  Wt 185 lb (83.915 kg)  BMI 24.41 kg/m2  General well-developed well-nourished male in no apparent distress Chest no rhonchi or wheezing Abdomen soft nontender no pulsatile mass noted 3+ femoral pulses bilaterally with well perfused lower extremities  Today I ordered a CT angiogram which I reviewed a computer. There is no evidence of endoleak noted. Aneurysm continues to be about 5.2 cm maximum diameter. There is no migration of the graft.        Assessment:     Doing well 5 weeks post aortic stent graft for infrarenal abdominal aortic aneurysm    Plan:     Return in 6 months with CT angiogram for continued followup

## 2011-07-27 NOTE — Progress Notes (Signed)
Addended by: Sharee Pimple on: 07/27/2011 03:12 PM   Modules accepted: Orders

## 2012-01-21 ENCOUNTER — Encounter: Payer: Self-pay | Admitting: Vascular Surgery

## 2012-01-25 ENCOUNTER — Ambulatory Visit (HOSPITAL_COMMUNITY)
Admission: RE | Admit: 2012-01-25 | Discharge: 2012-01-25 | Disposition: A | Discharge status: Home / Self Care | Payer: Medicare Other | Source: Ambulatory Visit | Attending: Vascular Surgery | Admitting: Vascular Surgery

## 2012-01-25 ENCOUNTER — Encounter: Payer: Self-pay | Admitting: Vascular Surgery

## 2012-01-25 ENCOUNTER — Ambulatory Visit (INDEPENDENT_AMBULATORY_CARE_PROVIDER_SITE_OTHER): Payer: Medicare Other | Admitting: Vascular Surgery

## 2012-01-25 ENCOUNTER — Other Ambulatory Visit: Payer: Self-pay | Admitting: Vascular Surgery

## 2012-01-25 VITALS — BP 122/79 | HR 54 | Resp 16 | Ht 73.0 in | Wt 203.5 lb

## 2012-01-25 DIAGNOSIS — I714 Abdominal aortic aneurysm, without rupture, unspecified: Secondary | ICD-10-CM | POA: Insufficient documentation

## 2012-01-25 DIAGNOSIS — Z48812 Encounter for surgical aftercare following surgery on the circulatory system: Secondary | ICD-10-CM

## 2012-01-25 MED ORDER — IOHEXOL 350 MG/ML SOLN
100.0000 mL | Freq: Once | INTRAVENOUS | Status: AC | PRN
  Administered 2012-01-25: 100 mL via INTRAVENOUS

## 2012-01-25 NOTE — Addendum Note (Signed)
Addended by: Sharee Pimple on: 01/25/2012 01:42 PM   Modules accepted: Orders

## 2012-01-25 NOTE — Progress Notes (Signed)
Subjective:     Patient ID: Joshua Bailey, male   DOB: 1955-06-13, 56 y.o.   MRN: [id number]  HPI this 57 year old male patient returns today for continued followup regarding his abdominal aortic aneurysm stent graft procedure which I performed 06/18/2011. He has no specific complaints. He has been ambulating well with no chest pain or abdominal or back pain.  Past Medical History  Diagnosis Date  . Stroke 2004  . Aortic aneurysm, abdominal   . History of nephrolithiasis 06/10/11    "have 2 right now"  . Complication of anesthesia 06/10/11    "had laser OR on teeth; can not put ETT down; must go thru nose"  . 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 Arthritis     "back and some in my hands"  . Chronic lower back pain   . Chronic knee pain     right; "it never stops hurting"    History  Substance Use Topics  . Smoking status: Current Everyday Smoker -- 0.5 packs/day for 35 years    Types: Cigarettes  . Smokeless tobacco: Never Used   Comment: 06/10/11 consult to smoking cessation counselor made  . Alcohol Use: No    Family History  Problem Relation Age of Onset  . Hyperlipidemia Mother   . Heart disease Father   . Hyperlipidemia Father   . Stroke Father   . Peripheral vascular disease Father   . Heart disease Brother   . Hyperlipidemia Brother   . Anesthesia problems Neg Hx   . Hypotension Neg Hx   . Malignant hyperthermia Neg Hx   . Pseudochol deficiency Neg Hx     No Known Allergies  Current outpatient prescriptions:ALPRAZolam (XANAX) 1 MG tablet, Take 2 mg by mouth at bedtime as needed. To help sleep., Disp: , Rfl: ;  ascorbic acid (VITAMIN C) 1000 MG tablet, Take 1,000 mg by mouth daily. , Disp: , Rfl: ;  aspirin EC 81 MG tablet, Take 81 mg by mouth every other day., Disp: , Rfl: ;  cholecalciferol (VITAMIN D) 1000 UNITS tablet, Take 2,000 Units by mouth daily. , Disp: , Rfl:    HYDROcodone-acetaminophen (NORCO) 10-325 MG per tablet, Take 1 tablet by mouth every 6 (six) hours as needed for pain., Disp: 30 tablet, Rfl: 0;  Omega-3 Fatty Acids (FISH OIL) 1200 MG CAPS, Take 1,200 mg by mouth 2 (two) times daily. , Disp: , Rfl:  No current facility-administered medications for this visit. Facility-Administered Medications Ordered in Other Visits: iohexol (OMNIPAQUE) 350 MG/ML injection 100 mL, 100 mL, Intravenous, Once PRN, Medication Radiologist, MD, 100 mL at 01/25/12 1141  BP 122/79  Pulse 54  Resp 16  Ht 6\' 1"  (1.854 m)  Wt 203 lb 8 oz (92.307 kg)  BMI 26.85 kg/m2  SpO2 98%  Body mass index is 26.85 kg/(m^2).        Review of Systems denies chest pain, dyspnea on exertion, PND, orthopnea, lateralizing weakness, claudication, all other systems are negative complete review of systems     Objective:   Physical Exam blood pressure 120/79 heart rate 54 respirations 16 Gen.-alert and oriented x3 in no apparent distress HEENT normal for age Lungs no rhonchi or wheezing Cardiovascular regular rhythm no murmurs carotid pulses 3+ palpable no bruits audible Abdomen soft nontender no palpable masses Musculoskeletal free of  major deformities Skin clear -no rashes Neurologic normal Lower extremities 3+ femoral  and dorsalis pedis pulses palpable bilaterally with no edema  Today a CT angiogram was performed which I reviewed by computer and also discussed with Dr. Denny Levy. The patient has a significant type II endoleak much larger than it was previous study 6 months ago where there was only a trickle of flow. This appears to be coming from the IMA. There does not appear to be a type I leak. The aneurysm sac diameter appears to have enlarged slightly by 0.5 cm.    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    Patient will see Dr. Irish Lack tomorrow to discuss possible embolization of IMA via arterial route. He will then return to see me in 3 months with repeat CT angiogram

## 2012-01-26 ENCOUNTER — Ambulatory Visit
Admission: RE | Admit: 2012-01-26 | Discharge: 2012-01-26 | Disposition: A | Discharge status: Home / Self Care | Payer: Medicare Other | Source: Ambulatory Visit | Attending: Vascular Surgery | Admitting: Vascular Surgery

## 2012-01-26 VITALS — BP 126/75 | HR 55 | Temp 98.3°F | Resp 15 | Ht 73.0 in | Wt 195.0 lb

## 2012-01-26 DIAGNOSIS — I714 Abdominal aortic aneurysm, without rupture: Secondary | ICD-10-CM

## 2012-01-27 NOTE — Progress Notes (Signed)
Patient ID: Joshua Bailey, male   DOB: August 31, 1955, 56 y.o.   MRN: [id number]  NEW PATIENT OFFICE VISIT  January 26, 2012  Joshua Bailey. Joshua Rochester, MD [facility] [address], [place] [address]  Re: Joshua Bailey (DOB: 04/19/1956)  Dear Joshua Bailey:  Thank you for sending Joshua Bailey for consultation regarding possible aortic endoleak repair. The patient is a 56 year old male with a history of abdominal aortic aneurysm demonstrated by prior CTA on 06/10/11. This CT demonstrated a 6 cm infrarenal abdominal aortic aneurysm without evidence of rupture. The patient underwent endovascular repair of the aorta on 06/18/11 with placement of a Gore Excluder device. First follow-up CTA on 07/27/11 demonstrated stable maximal sac diameter of 6 cm and retrograde opacification of the inferior mesenteric artery trunk without definable endoleak. Follow-up CTA yesterday on 01/25/12 demonstrated increased size of the aortic sac diameter to 6.5 cm transversely and opacification of a discrete type II endoleak in the anterior left aortic sac supplied by retrograde flow in the inferior mesenteric artery. An open primitive collateral channel is identified emanating from the inferior aspect of the celiac trunk and extending inferiorly to supply the proximal IMA trunk. A branch of this vessel supplies the transverse colon and this is felt to represent a replaced middle colic artery.  The patient states that he has had some abdominal discomfort since endograft placement. 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  Past Medical History: 1. 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 This initially progressed to lower extremity paralysis. He has since recovered with some mild left-sided numbness and weakness remaining. He is able to ambulate independently without assistance. 2. History of renal calculi. 3. History of multiple lumbar  spine decompressive and fusion procedures. 4. History of anorectal abscess drainage in 1995. 5. History of right knee reconstruction surgery in 2007 and 2008. 6. History of periodontal surgery in 2011.  Medications: Aspirin 81 mg daily, Xanax 1 mg prn, Vicodin one tablet every six hours as needed. Page 2 Re: Joshua Bailey (DOB: 11/01/1955)  Allergies: No known drug allergies.  Social History: The patient is married and has one child. [place] He has been disabled since 77. He smokes approximately half a pack of cigarettes a day.  Family History: Father with history of heart disease, stroke and peripheral vascular disease. Brother with history of heart disease.  Review of Systems: General: Height 6'1", weight 195 lbs. Eyes: The patient uses reading glasses. Gastrointestinal: No nausea, vomiting, diarrhea or constipation. Genitourinary: No hematuria, dysuria, increased urinary frequency or incontinence. Cardiovascular: No chest pain or palpitation. Musculoskeletal: Chronic back pain and joint pain. Respiratory: No cough or shortness of breath.  Exam: Vitals: Blood pressure 126/75, pulse 55, respirations 15, temperature 98.3. Oxygen saturation 96% on room air. Lungs: Clear to auscultation bilaterally. Heart: Regular rate and rhythm. Abdomen: Soft and nontender. No significant distention. No flank tenderness. Extremities: No edema.  Labs: Labs from January demonstrate normal renal function, hemoglobin and hematocrit. Platelet count was 140. PT 14.1, INR 1.07.  Imaging: I personally reviewed prior CTA studies.  Assessment: I met with Joshua Bailey and his wife. We reviewed imaging findings which demonstrate a prominent focal endoleak that visibly communicates with the proximal trunk of the inferior mesenteric artery. By CTA, a collateral/accessory pathway can be followed inferiorly from the celiac trunk to the level of the inferior mesenteric artery.  I do not see a  well-circumscribed artery of [place] between the SMA and IMA. The aortic sac has clearly enlarged in maximal diameter over the last six months. A few lumbar arteries show retrograde opacification but no definite supply to the endoleak.  Treatment options were reviewed with the patient including continued observation, transcatheter embolization of endoleak with catheterization of the collateral pathway between the celiac trunk and IMA and translumbar puncture of the aortic sac with embolization of the endoleak. Based on his CTA, I do believe that the collateral pathway between the celiac trunk and IMA should be able to be catheterized with a microcatheter. As long as the microcatheter can be advanced into the proximal IMA and to the level of the endoleak cavity, successful embolization should be able to be performed with embolization coils. Although the endoleak cavity is approachable percutaneously via translumbar puncture on the left, I believe that the transcatheter route would currently be less invasive and of lower risk.  Details of transcatheter embolization were reviewed with the patient, including risks. The risk of not currently performing any intervention would be further increase in size of the aneurysm sac over time with potential risk of spontaneous rupture. It is unclear whether some of the patient's symptoms of abdominal pain are related to increase in size of the aortic sac.  Page 2 Re: Joshua Bailey (DOB: 12/01/1955)  After reviewing details and discussing treatment options, the patient would like to proceed with scheduling attempted transcatheter embolization of the endoleak. This would be strictly an outpatient procedure performed at [facility] with likely same day discharge. We will begin the scheduling process.  Thank you again for sending Joshua Bailey for consultation and allowing me to participate in his care.  Sincerely,  Joshua Lack, MD Dortha Schwalbe 01/27/12  C: Joshua Moron, MD

## 2012-01-28 ENCOUNTER — Other Ambulatory Visit: Payer: Self-pay | Admitting: Vascular Surgery

## 2012-01-28 ENCOUNTER — Telehealth (HOSPITAL_COMMUNITY): Payer: Self-pay

## 2012-01-28 DIAGNOSIS — I714 Abdominal aortic aneurysm, without rupture: Secondary | ICD-10-CM

## 2012-01-28 NOTE — Telephone Encounter (Signed)
I spoke with Joshua Bailey and gave him the date and time for his procedure with Dr. Fredia Sorrow.  He had a good understanding of this.

## 2012-01-28 NOTE — Telephone Encounter (Signed)
I left a vm for the pt to call me back in regards to his apt I have scheduled

## 2012-02-09 ENCOUNTER — Encounter (HOSPITAL_COMMUNITY): Payer: Self-pay | Admitting: Pharmacy Technician

## 2012-02-17 ENCOUNTER — Other Ambulatory Visit: Payer: Self-pay | Admitting: Radiology

## 2012-02-17 ENCOUNTER — Other Ambulatory Visit: Payer: Self-pay | Admitting: Physician Assistant

## 2012-02-17 MED ORDER — DEXTROSE 5 % IV SOLN: 2.0000 g | Freq: Once | INTRAVENOUS | Status: DC

## 2012-02-22 ENCOUNTER — Other Ambulatory Visit: Payer: Self-pay | Admitting: Radiology

## 2012-02-23 ENCOUNTER — Ambulatory Visit (HOSPITAL_COMMUNITY)
Admission: RE | Admit: 2012-02-23 | Discharge: 2012-02-23 | Disposition: A | Discharge status: Home / Self Care | Payer: Medicare Other | Source: Ambulatory Visit | Attending: Interventional Radiology | Admitting: Interventional Radiology

## 2012-02-23 ENCOUNTER — Other Ambulatory Visit (HOSPITAL_COMMUNITY): Payer: Self-pay | Admitting: Interventional Radiology

## 2012-02-23 ENCOUNTER — Encounter (HOSPITAL_COMMUNITY): Payer: Self-pay

## 2012-02-23 VITALS — BP 152/87 | HR 59 | Temp 98.3°F | Resp 18 | Ht 73.0 in | Wt 200.0 lb

## 2012-02-23 DIAGNOSIS — Y831 Surgical operation with implant of artificial internal device as the cause of abnormal reaction of the patient, or of later complication, without mention of misadventure at the time of the procedure: Secondary | ICD-10-CM | POA: Insufficient documentation

## 2012-02-23 DIAGNOSIS — T82598A Other mechanical complication of other cardiac and vascular devices and implants, initial encounter: Secondary | ICD-10-CM | POA: Insufficient documentation

## 2012-02-23 DIAGNOSIS — I714 Abdominal aortic aneurysm, without rupture: Secondary | ICD-10-CM

## 2012-02-23 LAB — BASIC METABOLIC PANEL
BUN: 19 mg/dL (ref 6–23)
GFR calc non Af Amer: >90 mL/min (ref >90)
Glucose, Bld: 106 mg/dL — ABNORMAL HIGH (ref 70–99)
Potassium: 4 mEq/L (ref 3.5–5.1)

## 2012-02-23 LAB — CBC
HCT: 45.1 % (ref 39.0–52.0)
MCV: 91.5 fL (ref 78.0–100.0)
RDW: 13.1 % (ref 11.5–15.5)
WBC: 5.7 10*3/uL (ref 4.0–10.5)

## 2012-02-23 MED ORDER — HYDROMORPHONE HCL PF 1 MG/ML IJ SOLN
1.0000 mg | Freq: Once | INTRAMUSCULAR | Status: AC
  Administered 2012-02-23: 1 mg via INTRAVENOUS
  Filled 2012-02-23: qty 1

## 2012-02-23 MED ORDER — SODIUM CHLORIDE 0.9 % IV SOLN
INTRAVENOUS | Status: DC
  Administered 2012-02-23: 07:00:00 via INTRAVENOUS

## 2012-02-23 MED ORDER — IOHEXOL 300 MG/ML  SOLN
150.0000 mL | Freq: Once | INTRAMUSCULAR | Status: AC | PRN
  Administered 2012-02-23: 125 mL via INTRAVENOUS

## 2012-02-23 MED ORDER — MIDAZOLAM HCL 2 MG/2ML IJ SOLN
INTRAMUSCULAR | Status: AC
  Filled 2012-02-23: qty 4

## 2012-02-23 MED ORDER — MIDAZOLAM HCL 5 MG/5ML IJ SOLN
INTRAMUSCULAR | Status: AC | PRN
  Administered 2012-02-23: 1 mg via INTRAVENOUS
  Administered 2012-02-23 (×2): 2 mg via INTRAVENOUS
  Administered 2012-02-23 (×3): 1 mg via INTRAVENOUS
  Administered 2012-02-23: 2 mg via INTRAVENOUS
  Administered 2012-02-23: 1 mg via INTRAVENOUS

## 2012-02-23 MED ORDER — MIDAZOLAM HCL 2 MG/2ML IJ SOLN
INTRAMUSCULAR | Status: AC
  Filled 2012-02-23: qty 2

## 2012-02-23 MED ORDER — FENTANYL CITRATE 0.05 MG/ML IJ SOLN
INTRAMUSCULAR | Status: AC | PRN
  Administered 2012-02-23 (×5): 50 ug via INTRAVENOUS

## 2012-02-23 MED ORDER — NITROGLYCERIN 1 MG/10 ML FOR IR/CATH LAB
INTRA_ARTERIAL | Status: AC
  Filled 2012-02-23: qty 10

## 2012-02-23 MED ORDER — FENTANYL CITRATE 0.05 MG/ML IJ SOLN
INTRAMUSCULAR | Status: AC
  Filled 2012-02-23: qty 4

## 2012-02-23 MED ORDER — MIDAZOLAM HCL 2 MG/2ML IJ SOLN
INTRAMUSCULAR | Status: AC
  Filled 2012-02-23: qty 6

## 2012-02-23 MED ORDER — CEFAZOLIN SODIUM-DEXTROSE 2-3 GM-% IV SOLR
2.0000 g | Freq: Once | INTRAVENOUS | Status: AC
  Administered 2012-02-23: 2 g via INTRAVENOUS
  Filled 2012-02-23: qty 50

## 2012-02-23 MED ORDER — HYDROCODONE-ACETAMINOPHEN 5-325 MG PO TABS: 1.0000 | ORAL_TABLET | ORAL | Status: DC | PRN

## 2012-02-23 NOTE — Progress Notes (Signed)
  Subjective: Status post aortic endoleak embolization.  Some LBP; better after pain meds.  No abdominal or flank pain.  Objective: Vital signs in last 24 hours: Temp:  [97 F (36.1 C)-98.3 F (36.8 C)] 98.3 F (36.8 C) (10/02 1300) Pulse Rate:  [39-67] 50  (10/02 1541) Resp:  [9-24] 18  (10/02 1300) BP: (106-150)/(56-95) 123/76 mmHg (10/02 1541) SpO2:  [95 %-100 %] 98 % (10/02 1541) Weight:  [200 lb (90.719 kg)] 200 lb (90.719 kg) (10/02 0714)    Intake/Output from previous day:   Intake/Output this shift:   Exam:  Right groin w/o hematoma or tenderness.  Normal palpable femoral artery pulse.  Lab Results:   [facility] 02/23/12 0721  WBC 5.7  HGB 15.6  HCT 45.1  PLT 169   BMET  Basename 02/23/12 0721  NA 141  K 4.0  CL 106  CO2 24  GLUCOSE 106*  BUN 19  CREATININE 0.92  CALCIUM 9.2   PT/INR  Basename 02/23/12 0721  LABPROT 12.5  INR 0.94   ABG No results found for this basename: PHART:2,PCO2:2,PO2:2,HCO3:2 in the last 72 hours  Studies/Results: No results found.  Anti-infectives: Anti-infectives     Start     Dose/Rate Route Frequency Ordered Stop   02/23/12 0830   ceFAZolin (ANCEF) IVPB 2 g/50 mL premix        2 g 100 mL/hr over 30 Minutes Intravenous  Once 02/23/12 1610 02/23/12 9604          Assessment/Plan: s/p * No surgery found * Doing well after aortic endoleak embolization.  Plan discharge today and Clinic follow up in 3 weeks.  Repeat CTA in 2 months.  LOS: 0 days    Saquan Furtick T 02/23/2012

## 2012-02-23 NOTE — ED Notes (Signed)
MD aware of low HR 38-40. Continue to monitor BP and HR per MD. Pt arousable and states he is "fine".

## 2012-02-23 NOTE — H&P (Signed)
Chief Complaint: AAA s/p ENdograft repair with endoleak Referring Physician:Yamagata/Lawason HPI: Joshua Bailey is an 56 y.o. male with recent AAA endorepair is found to have a persistent type II endoleak. CTA reveals this to be coming from small collateral branch. Pt has seen Dr. Fredia Sorrow in clinic to discuss angiogram approach to correct this problem. Please see full consult note from Dr. Fredia Sorrow done 9/4. He denies any recent changes in PMHx, meds, or illnesses. He is ready for procedure.  Past Medical History:  Past Medical History  Diagnosis Date  . Stroke 2004  . Aortic aneurysm, abdominal   . History of nephrolithiasis 06/10/11    "have 2 right now"  . Complication of anesthesia 06/10/11    "had laser OR on teeth; can not put ETT down; must go thru nose"  . 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 Arthritis     "back and some in my hands"  . Chronic lower back pain   . Chronic knee pain     right; "it never stops hurting"    Past Surgical History:  Past Surgical History  Procedure Date  . Spine surgery 04/2011    Lateral Internal sphinecterotomy  . Back surgery   . Knee surgery 2007; 2008    unsuccessful right knee reconstruction; unsuccessful right knee reconstruction, 2nd time  . Pilonidal cyst / sinus excision 1976  . Laminectomy 06/10/11    "I've had the last 4 lumbar discs removed at different dates "  . Incision and drainage perirectal abscess ~ 1995  . Anal sphincterotomy 05/05/11  . Endovascular stent insertion 06/18/2011    Procedure: ENDOVASCULAR STENT GRAFT INSERTION;  Surgeon: Pryor Ochoa, MD;  Location: [facility] OR;  Service: Vascular;  Laterality: N/A;    Family History:  Family History  Problem Relation Age of Onset  . Hyperlipidemia Mother   . Heart disease Father   . Hyperlipidemia Father   . Stroke Father   . Peripheral vascular disease Father   . Heart  disease Brother   . Hyperlipidemia Brother   . Anesthesia problems Neg Hx   . Hypotension Neg Hx   . Malignant hyperthermia Neg Hx   . Pseudochol deficiency Neg Hx     Social History:  reports that he has been smoking Cigarettes.  He has a 17.5 pack-year smoking history. He has never used smokeless tobacco. He reports that he does not drink alcohol or use illicit drugs.  Allergies: No Known Allergies  Medications: ALPRAZolam (XANAX) 1 MG tablet (Taking) Sig - Route: Take 2 mg by mouth at bedtime as needed. To help sleep. - Oral Class: Historical Med Number of times this order has been changed since signing: 4 Order [address] ascorbic acid (VITAMIN C) 1000 MG tablet (Taking) Sig - Route: Take 1,000 mg by mouth daily. - Oral Class: Historical Med Number of times this order has been changed since signing: 2 Order [address] aspirin EC 81 MG tablet (Taking) Sig - Route: Take 81 mg by mouth every other day. - Oral Class: Historical Med Number of times this order has been changed since signing: 1 Order [address] cholecalciferol (VITAMIN D) 1000 UNITS tablet (Taking) Sig - Route: Take 2,000 Units by mouth daily. - Oral Class: Historical Med Number of times this order has been changed since signing: 3 Order [address] HYDROcodone-acetaminophen (NORCO) 10-325 MG per tablet (Taking) 06/19/2011 Sig -  Route: Take 1 tablet by mouth every 6 (six) hours as needed. For pain - Oral Class: Historical Med Number of times this order has been changed since signing: 1 Order [address] Omega-3 Fatty Acids (FISH OIL) 1200 MG CAPS (Taking) Sig - Route: Take 1,200 mg by mouth 2 (two) times daily. - Oral Class: Historical Med  Please HPI for pertinent positives, otherwise complete 10 system ROS negative.  Physical Exam: Blood pressure 132/80, pulse 61, temperature 97 F (36.1 C), temperature source Oral, resp. rate 16, height 6\' 1"  (1.854 m), weight 200 lb (90.719 kg), SpO2 95.00%. Body mass index is 26.39  kg/(m^2).   General Appearance:  Alert, cooperative, no distress, appears stated age  Head:  Normocephalic, without obvious abnormality, atraumatic  ENT: Unremarkable  Neck: Supple, symmetrical, trachea midline, no adenopathy, thyroid: not enlarged, symmetric, no tenderness/mass/nodules  Lungs:   Clear to auscultation bilaterally, no w/r/r, respirations unlabored without use of accessory muscles.  Heart:  Regular rate and rhythm, S1, S2 normal, no murmur, rub or gallop. Carotids 2+ without bruit.  Abdomen:   Soft, non-tender, non distended. Bowel sounds active all four quadrants,  no masses, no organomegaly.  Extremities: Extremities normal, atraumatic, no cyanosis or edema  Pulses: 2+ and symmetric  Skin: Skin color, texture, turgor normal, no rashes or lesions  Neurologic: Normal affect, no gross deficits.   Results for orders placed during the hospital encounter of 02/23/12 (from the past 48 hour(s))  CBC     Status: Normal   Collection Time   02/23/12  7:21 AM      Component Value Range Comment   WBC 5.7  4.0 - 10.5 K/uL    RBC 4.93  4.22 - 5.81 MIL/uL    Hemoglobin 15.6  13.0 - 17.0 g/dL    HCT 78.4  69.6 - 29.5 %    MCV 91.5  78.0 - 100.0 fL    MCH 31.6  26.0 - 34.0 pg    MCHC 34.6  30.0 - 36.0 g/dL    RDW 28.4  13.2 - 44.0 %    Platelets 169  150 - 400 K/uL   APTT     Status: Normal   Collection Time   02/23/12  7:21 AM      Component Value Range Comment   aPTT 34  24 - 37 seconds   BASIC METABOLIC PANEL     Status: Abnormal   Collection Time   02/23/12  7:21 AM      Component Value Range Comment   Sodium 141  135 - 145 mEq/L    Potassium 4.0  3.5 - 5.1 mEq/L HEMOLYSIS AT THIS LEVEL MAY AFFECT RESULT   Chloride 106  96 - 112 mEq/L    CO2 24  19 - 32 mEq/L    Glucose, Bld 106 (*) 70 - 99 mg/dL    BUN 19  6 - 23 mg/dL    Creatinine, Ser 1.02  0.50 - 1.35 mg/dL    Calcium 9.2  8.4 - 72.5 mg/dL    GFR calc non Af Amer >90  >90 mL/min    GFR calc Af Amer >90  >90 mL/min    PROTIME-INR     Status: Normal   Collection Time   02/23/12  7:21 AM      Component Value Range Comment   Prothrombin Time 12.5  11.6 - 15.2 seconds    INR 0.94  0.00 - 1.49    No results found.  Assessment/Plan Type  II endoleak of recent AAA Endograft repair. Labs ok Reviewed procedure and risks, recovery. Consent signed in chart 2g Ancef ordered/to be given  Brayton El PA-C 02/23/2012, 8:13 AM

## 2012-02-23 NOTE — ED Notes (Signed)
MD informed of volume of sedation medication used to this point.  Versed:  9 mg, Fentanyl 200 mcg.  Instructed to continue to sedate to maintain current sedation state.

## 2012-02-23 NOTE — Procedures (Signed)
Procedure:  Visceral arteriography of celiac axis and replaced middle colic artery and IMA; transcatheter embolization of aortic endoleak. Findings:  Able to catheterize aortic sac at level of endoleak via the replaced colic artery.  Sac and IMA trunk embolized with series of Concerto microcoils.  Good occlusion post embolization. See full dictated report.

## 2012-02-23 NOTE — H&P (Signed)
Agree.  For arteriography with possible endoleak repair/embolization today.

## 2012-02-24 ENCOUNTER — Other Ambulatory Visit: Payer: Self-pay | Admitting: Interventional Radiology

## 2012-02-24 DIAGNOSIS — T82330A Leakage of aortic (bifurcation) graft (replacement), initial encounter: Secondary | ICD-10-CM

## 2012-03-13 ENCOUNTER — Telehealth: Payer: Self-pay | Admitting: Emergency Medicine

## 2012-03-13 NOTE — Telephone Encounter (Signed)
ERICA W/ DR ERICKSON'S OFFICE CALLED TO SEE IF PT NEEDED AN ANTIBIOTIC PRIOR TO DENTAL CLEANING TODAY.  PER DR Fredia Sorrow AT [facility] TODAY, SAID NO, UNLESS HE HAS A HEART VALVE PROBLEM.  CALLED OFFICE BACK TO MAKE THEM AWARE.

## 2012-03-15 ENCOUNTER — Ambulatory Visit
Admission: RE | Admit: 2012-03-15 | Discharge: 2012-03-15 | Disposition: A | Discharge status: Home / Self Care | Payer: Medicare Other | Source: Ambulatory Visit | Attending: Interventional Radiology | Admitting: Interventional Radiology

## 2012-03-15 DIAGNOSIS — T82330A Leakage of aortic (bifurcation) graft (replacement), initial encounter: Secondary | ICD-10-CM

## 2012-03-15 NOTE — Progress Notes (Signed)
F/U ENDOLEAK REPAIR////PT DENIES N&V, AFEBRILE, , DOES HAVE SOME BACK PAIN AND RADIATES AROUND TO THE LATERAL RIGHT SIDE ONLY.   THAT IS BETTER EACH DAY.

## 2012-03-16 NOTE — Progress Notes (Signed)
Patient ID: Joshua Bailey, male   DOB: Jan 31, 1956, 56 y.o.   MRN: [id number]  ESTABLISHED PATIENT OFFICE VISIT  Chief Complaint: Status post transcatheter embolization of aortic endoleak on 02/23/2012.  History: Mr. Siegmann returns for initial follow-up 3 weeks after transcatheter treatment of a type 2 aortic endoleak via retrograde flow in the inferior mesenteric artery. The endoleak and IMA trunk were able to be embolized via access of a replaced middle colic artery emanating from the celiac axis. After the procedure, the patient has now fully recovered and has assumed normal activity. His symptoms of abdominal discomfort and right flank discomfort have improved. He has no right groin pain or lower extremity symptoms after the procedure.  Review of Systems: No fever or chills. No significant abdominal or back pain. No nausea or vomiting.  Exam: Vital signs: Blood pressure 133/81, pulse 66, respirations 12, temperature 97.9, oxygen saturation 97% on room air. Abdomen: Soft, nontender and nondistended. Extremities: Right groin without hematoma. Normal palpable right femoral pulse.  Assessment and Plan: The patient is doing well 3 weeks after transcatheter aortic endoleak embolization. He shows no signs of complication. Vague symptoms of abdominal and flank discomfort have improved after embolization. CTA follow-up will be performed in early December. The patient will also follow up with Dr. Hart Rochester at that time.

## 2012-03-29 ENCOUNTER — Other Ambulatory Visit: Payer: Self-pay | Admitting: Interventional Radiology

## 2012-03-29 DIAGNOSIS — T82330A Leakage of aortic (bifurcation) graft (replacement), initial encounter: Secondary | ICD-10-CM

## 2012-04-24 ENCOUNTER — Encounter: Payer: Self-pay | Admitting: Vascular Surgery

## 2012-04-25 ENCOUNTER — Other Ambulatory Visit: Payer: Self-pay | Admitting: *Deleted

## 2012-04-25 ENCOUNTER — Ambulatory Visit
Admission: RE | Admit: 2012-04-25 | Discharge: 2012-04-25 | Disposition: A | Discharge status: Home / Self Care | Payer: Medicare Other | Source: Ambulatory Visit | Attending: Interventional Radiology | Admitting: Interventional Radiology

## 2012-04-25 ENCOUNTER — Ambulatory Visit (INDEPENDENT_AMBULATORY_CARE_PROVIDER_SITE_OTHER): Payer: Medicare Other | Admitting: Vascular Surgery

## 2012-04-25 ENCOUNTER — Other Ambulatory Visit: Payer: Medicare Other

## 2012-04-25 ENCOUNTER — Encounter: Payer: Self-pay | Admitting: Vascular Surgery

## 2012-04-25 ENCOUNTER — Ambulatory Visit
Admission: RE | Admit: 2012-04-25 | Discharge: 2012-04-25 | Disposition: A | Discharge status: Home / Self Care | Payer: Medicare Other | Source: Ambulatory Visit | Attending: Vascular Surgery | Admitting: Vascular Surgery

## 2012-04-25 VITALS — BP 145/79 | HR 62 | Resp 20 | Ht 73.0 in | Wt 209.0 lb

## 2012-04-25 DIAGNOSIS — I714 Abdominal aortic aneurysm, without rupture, unspecified: Secondary | ICD-10-CM

## 2012-04-25 DIAGNOSIS — Z48812 Encounter for surgical aftercare following surgery on the circulatory system: Secondary | ICD-10-CM

## 2012-04-25 DIAGNOSIS — T82330A Leakage of aortic (bifurcation) graft (replacement), initial encounter: Secondary | ICD-10-CM

## 2012-04-25 MED ORDER — IOHEXOL 350 MG/ML SOLN
80.0000 mL | Freq: Once | INTRAVENOUS | Status: AC | PRN
  Administered 2012-04-25: 80 mL via INTRAVENOUS

## 2012-04-25 NOTE — Progress Notes (Signed)
Subjective:     Patient ID: Joshua Bailey, male   DOB: 02-05-1956, 56 y.o.   MRN: [id number]  HPI this 56 year old male returns for continued followup regarding his aortic stent graft repair of an abdominal aortic aneurysm which measured 6 cm in diameter when it was repaired in January of 2013 with a Gore endograft patient developed an endoleak through the IMA and aneurysm diameter increased to 6.5 cm requiring coil embolization of the IMA by Dr. Fredia Sorrow last month. Patient has done well since then. He did have some abdominal discomfort after the procedure that has now resolved. His activity level is returning to normal. He denies claudication symptoms.  Past Medical History  Diagnosis Date  . Stroke 2004  . Aortic aneurysm, abdominal   . History of nephrolithiasis 06/10/11    "have 2 right now"  . Complication of anesthesia 06/10/11    "had laser OR on teeth; can not put ETT down; must go thru nose"  . 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 Arthritis     "back and some in my hands"  . Chronic lower back pain   . Chronic knee pain     right; "it never stops hurting"    History  Substance Use Topics  . Smoking status: Current Every Day Smoker -- 0.5 packs/day for 35 years    Types: Cigarettes  . Smokeless tobacco: Never Used     Comment: 06/10/11 consult to smoking cessation counselor made  . Alcohol Use: No    Family History  Problem Relation Age of Onset  . Hyperlipidemia Mother   . Heart disease Father   . Hyperlipidemia Father   . Stroke Father   . Peripheral vascular disease Father   . Heart disease Brother   . Hyperlipidemia Brother   . Anesthesia problems Neg Hx   . Hypotension Neg Hx   . Malignant hyperthermia Neg Hx   . Pseudochol deficiency Neg Hx     No Known Allergies  Current outpatient prescriptions:ALPRAZolam (XANAX) 1 MG tablet, Take 2 mg by mouth at bedtime as  needed. To help sleep., Disp: , Rfl: ;  aspirin EC 81 MG tablet, Take 81 mg by mouth every other day., Disp: , Rfl: ;  cholecalciferol (VITAMIN D) 1000 UNITS tablet, Take 2,000 Units by mouth daily. , Disp: , Rfl: ;  HYDROcodone-acetaminophen (NORCO) 10-325 MG per tablet, Take 1 tablet by mouth every 6 (six) hours as needed. For pain, Disp: , Rfl:  Omega-3 Fatty Acids (FISH OIL) 1200 MG CAPS, Take 1,200 mg by mouth 2 (two) times daily. , Disp: , Rfl: ;  ascorbic acid (VITAMIN C) 1000 MG tablet, Take 1,000 mg by mouth daily. , Disp: , Rfl:  No current facility-administered medications for this visit. Facility-Administered Medications Ordered in Other Visits: [COMPLETED] iohexol (OMNIPAQUE) 350 MG/ML injection 80 mL, 80 mL, Intravenous, Once PRN, Medication Radiologist, MD, 80 mL at 04/25/12 1122  BP 145/79  Pulse 62  Resp 20  Ht 6\' 1"  (1.854 m)  Wt 209 lb (94.802 kg)  BMI 27.57 kg/m2  Body mass index is 27.57 kg/(m^2).          Review of Systems denies chest pain, dyspnea on exertion, PND, orthopnea, mouth cyst, with claudication. Denies all symptoms in other review of systems    Objective:   Physical Exam blood pressure 145/79 heart rate 62 respirations 20 Gen.-alert and  oriented x3 in no apparent distress HEENT normal for age Lungs no rhonchi or wheezing Cardiovascular regular rhythm no murmurs carotid pulses 3+ palpable no bruits audible Abdomen soft nontender no palpable masses Musculoskeletal free of  major deformities Skin clear -no rashes Neurologic normal Lower extremities 3+ femoral and dorsalis pedis pulses palpable bilaterally with no edema  Today I ordered a CT angiogram which are reviewed and interpreted. There is no significant endoleak noted today in the IMA is successfully closed. The diameter of the aneurysm sac has decreased to 6.2 x 5.5-down from 6.5 prior to the IMA embolization      Assessment:     Successful embolization of IMA to close endoleak-status  post endovascular aortic aneurysm repair    Plan:     Return in 6 months with CT angiogram and see me

## 2012-04-25 NOTE — Progress Notes (Signed)
Patient ID: Joshua Bailey, male   DOB: 1956-03-25, 56 y.o.   MRN: [id number]  ESTABLISHED PATIENT OFFICE VISIT  Chief Complaint: Status post transcatheter embolization of aortic endoleak on 02/23/2012.  History: [name] returns for follow-up after CTA today. He remains asymptomatic after the embolization procedure and has no complaints of abdominal or flank pain. He is back to regular activity.  Review of Systems: No fever or chills. No lower extremity pain or claudication.  Exam: Vital signs: Blood pressure 140/80, pulse 58, respirations 14, temperature 97.7, oxygen saturation 98% on room air. Abdomen: Soft and nontender. No flank tenderness.  Imaging: Follow-up CTA was performed today just prior to the office visit. This demonstrates decrease in aneurysm sac size from 5.7 x 6.5 cm previously to 5.5 x 6.2 cm currently after coil embolization of the endoleak and the supplying inferior mesenteric artery trunk. There is no evidence of visible further endoleak by imaging.  Assessment and Plan: The patient is doing well after embolization with initial 35-month follow-up CTA demonstrating decrease in aneurysm sac size, no further demonstrable endoleak and no evidence of complication. The patient is seeing Dr. Hart Rochester in follow-up this afternoon. I told the patient that I will leave future follow- up CTA decisions up to Dr. Hart Rochester.

## 2012-04-25 NOTE — Progress Notes (Signed)
Has resumed normal activities post procedure.  Some activities limited due to back issues.    Patient states that abdominal pressure has decreased post procedure.  Appetite:  Good.  Weight: stable.  Sleeping: well.  Patient states that Dr Levora Angel recently prescribed Elavil.  Patient has taken it once on 04/24/2012 & is concerned about side effects.  He slept all afternoon afterwards, not usual for him.  Has f/u app't w/ Dr Hart Rochester this afternoon.

## 2012-04-26 ENCOUNTER — Other Ambulatory Visit: Payer: Self-pay | Admitting: Emergency Medicine

## 2012-10-18 ENCOUNTER — Ambulatory Visit (HOSPITAL_COMMUNITY)
Admission: RE | Admit: 2012-10-18 | Discharge: 2012-10-18 | Disposition: A | Discharge status: Home / Self Care | Payer: Medicare Other | Source: Ambulatory Visit | Attending: Vascular Surgery | Admitting: Vascular Surgery

## 2012-10-18 DIAGNOSIS — I714 Abdominal aortic aneurysm, without rupture, unspecified: Secondary | ICD-10-CM | POA: Insufficient documentation

## 2012-10-18 DIAGNOSIS — D696 Thrombocytopenia, unspecified: Secondary | ICD-10-CM | POA: Insufficient documentation

## 2012-10-18 LAB — CREATININE, SERUM
Creatinine, Ser: 1.1 mg/dL (ref 0.50–1.35)
GFR calc non Af Amer: 73 mL/min — ABNORMAL LOW (ref >90)

## 2012-10-18 LAB — BUN: BUN: 20 mg/dL (ref 6–23)

## 2012-10-23 ENCOUNTER — Encounter: Payer: Self-pay | Admitting: Vascular Surgery

## 2012-10-24 ENCOUNTER — Encounter: Payer: Self-pay | Admitting: Vascular Surgery

## 2012-10-24 ENCOUNTER — Ambulatory Visit (INDEPENDENT_AMBULATORY_CARE_PROVIDER_SITE_OTHER): Payer: Medicare Other | Admitting: Vascular Surgery

## 2012-10-24 ENCOUNTER — Ambulatory Visit
Admission: RE | Admit: 2012-10-24 | Discharge: 2012-10-24 | Disposition: A | Discharge status: Home / Self Care | Payer: Medicare Other | Source: Ambulatory Visit | Attending: Vascular Surgery | Admitting: Vascular Surgery

## 2012-10-24 VITALS — BP 163/79 | HR 54 | Resp 18 | Ht 73.0 in | Wt 209.0 lb

## 2012-10-24 DIAGNOSIS — I714 Abdominal aortic aneurysm, without rupture: Secondary | ICD-10-CM

## 2012-10-24 DIAGNOSIS — Z48812 Encounter for surgical aftercare following surgery on the circulatory system: Secondary | ICD-10-CM

## 2012-10-24 MED ORDER — IOHEXOL 300 MG/ML  SOLN
80.0000 mL | Freq: Once | INTRAMUSCULAR | Status: AC | PRN
  Administered 2012-10-24: 80 mL via INTRAVENOUS

## 2012-10-24 NOTE — Addendum Note (Signed)
Addended by: Adria Dill L on: 10/24/2012 12:21 PM   Modules accepted: Orders

## 2012-10-24 NOTE — Progress Notes (Signed)
Subjective:     Patient ID: Joshua Bailey, male   DOB: [birth year], 57 y.o.   MRN: [id number]  HPI this 57 year old male returns for continued followup regarding his aortic stent graft repair of abdominal aortic aneurysm which was performed in January of 2013. He developed an Endo leak-type II through his IMA which was treated successfully with coil embolization by Dr., Fredia Sorrow. His was performed in December of 2013. His last visit revealed slight shrinkage of the sac with no assistant endoleak. Today he denies any abdominal or back symptoms. His biggest complaint is right knee pain due to arthritis.  Past Medical History  Diagnosis Date  . Stroke 2004  . Aortic aneurysm, abdominal   . History of nephrolithiasis 06/10/11    "have 2 right now"  . Complication of anesthesia 06/10/11    "had laser OR on teeth; can not put ETT down; must go thru nose"  . 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 Arthritis     "back and some in my hands"  . Chronic lower back pain   . Chronic knee pain     right; "it never stops hurting"    History  Substance Use Topics  . Smoking status: Former Smoker -- 0.50 packs/day for 35 years    Types: Cigarettes    Quit date: 08/24/2012  . Smokeless tobacco: Never Used     Comment: 06/10/11 consult to smoking cessation counselor made  . Alcohol Use: No    Family History  Problem Relation Age of Onset  . Hyperlipidemia Mother   . Heart disease Father   . Hyperlipidemia Father   . Stroke Father   . Peripheral vascular disease Father   . Heart disease Brother   . Hyperlipidemia Brother   . Anesthesia problems Neg Hx   . Hypotension Neg Hx   . Malignant hyperthermia Neg Hx   . Pseudochol deficiency Neg Hx     No Known Allergies  Current outpatient prescriptions:ALPRAZolam (XANAX) 1 MG tablet, Take 2 mg by mouth at bedtime as needed. To help sleep., Disp: , Rfl: ;   ascorbic acid (VITAMIN C) 1000 MG tablet, Take 1,000 mg by mouth daily. , Disp: , Rfl: ;  aspirin EC 81 MG tablet, Take 81 mg by mouth every other day., Disp: , Rfl: ;  cholecalciferol (VITAMIN D) 1000 UNITS tablet, Take 2,000 Units by mouth daily. , Disp: , Rfl:  HYDROcodone-acetaminophen (NORCO) 10-325 MG per tablet, Take 1 tablet by mouth every 6 (six) hours as needed. For pain, Disp: , Rfl: ;  Omega-3 Fatty Acids (FISH OIL) 1200 MG CAPS, Take 1,200 mg by mouth 2 (two) times daily. , Disp: , Rfl:   BP 163/79  Pulse 54  Resp 18  Ht 6\' 1"  (1.854 m)  Wt 209 lb (94.802 kg)  BMI 27.58 kg/m2  Body mass index is 27.58 kg/(m^2).           Review of Systems denies chest pain, dyspnea on exertion, PND, orthopnea, chronic bronchitis, claudication. Severe right knee pain     Objective:   Physical Exam blood pressure 163/79 heart rate 84 respirations 18 Gen.-alert and oriented x3 in no apparent distress HEENT normal for age Lungs no rhonchi or wheezing Cardiovascular regular rhythm no murmurs carotid pulses 3+ palpable no bruits audible Abdomen soft nontender no palpable masses Musculoskeletal free of  major deformities Skin clear -no rashes  Neurologic normal Lower extremities 3+ femoral and dorsalis pedis pulses palpable bilaterally with no edema  Data ordered a CT angiogram which I reviewed the computer. The aneurysm sac has continued to contract and now measures 4.9 x 5.8 maximum diameter. There is no endoleak persistent and the coils are unchanged and the IMA    Assessment:     Successful coil embolization of IMA which was causing type II endoleak following endovascular stent graft AAA with slight contraction of sac diameter and no persistent endoleak    Plan:     Return in one year with CT angiogram of aortic aneurysm repair for continued followup

## 2012-11-02 ENCOUNTER — Telehealth: Payer: Self-pay | Admitting: Radiology

## 2012-11-02 NOTE — Telephone Encounter (Signed)
error 

## 2012-11-10 ENCOUNTER — Encounter (HOSPITAL_COMMUNITY)
Admission: EM | Disposition: A | Discharge status: Home / Self Care | Payer: Self-pay | Source: Home / Self Care | Attending: Emergency Medicine

## 2012-11-10 ENCOUNTER — Emergency Department (HOSPITAL_COMMUNITY)
Admission: EM | Admit: 2012-11-10 | Discharge: 2012-11-10 | Disposition: A | Discharge status: Other Acute Inpatient Hospital | Payer: Medicare Other | Source: Home / Self Care | Attending: Emergency Medicine | Admitting: Emergency Medicine

## 2012-11-10 ENCOUNTER — Ambulatory Visit (HOSPITAL_COMMUNITY)
Admission: EM | Admit: 2012-11-10 | Discharge: 2012-11-10 | Disposition: A | Discharge status: Home / Self Care | Payer: Medicare Other | Attending: Urology | Admitting: Urology

## 2012-11-10 ENCOUNTER — Encounter (HOSPITAL_COMMUNITY): Payer: Self-pay | Admitting: Anesthesiology

## 2012-11-10 ENCOUNTER — Encounter (HOSPITAL_COMMUNITY): Payer: Self-pay | Admitting: Emergency Medicine

## 2012-11-10 ENCOUNTER — Emergency Department (HOSPITAL_COMMUNITY): Payer: Medicare Other

## 2012-11-10 ENCOUNTER — Encounter (HOSPITAL_COMMUNITY): Payer: Self-pay | Admitting: *Deleted

## 2012-11-10 ENCOUNTER — Inpatient Hospital Stay: Admit: 2012-11-10 | Payer: Self-pay | Admitting: Urology

## 2012-11-10 ENCOUNTER — Emergency Department (HOSPITAL_COMMUNITY): Payer: Medicare Other | Admitting: Anesthesiology

## 2012-11-10 DIAGNOSIS — Z7982 Long term (current) use of aspirin: Secondary | ICD-10-CM | POA: Insufficient documentation

## 2012-11-10 DIAGNOSIS — M129 Arthropathy, unspecified: Secondary | ICD-10-CM | POA: Insufficient documentation

## 2012-11-10 DIAGNOSIS — G373 Acute transverse myelitis in demyelinating disease of central nervous system: Secondary | ICD-10-CM | POA: Insufficient documentation

## 2012-11-10 DIAGNOSIS — N201 Calculus of ureter: Secondary | ICD-10-CM

## 2012-11-10 DIAGNOSIS — Z8679 Personal history of other diseases of the circulatory system: Secondary | ICD-10-CM | POA: Insufficient documentation

## 2012-11-10 DIAGNOSIS — Z87891 Personal history of nicotine dependence: Secondary | ICD-10-CM | POA: Insufficient documentation

## 2012-11-10 DIAGNOSIS — M25569 Pain in unspecified knee: Secondary | ICD-10-CM | POA: Insufficient documentation

## 2012-11-10 DIAGNOSIS — G8929 Other chronic pain: Secondary | ICD-10-CM | POA: Insufficient documentation

## 2012-11-10 DIAGNOSIS — Z8673 Personal history of transient ischemic attack (TIA), and cerebral infarction without residual deficits: Secondary | ICD-10-CM | POA: Insufficient documentation

## 2012-11-10 DIAGNOSIS — N200 Calculus of kidney: Secondary | ICD-10-CM | POA: Insufficient documentation

## 2012-11-10 DIAGNOSIS — Z8669 Personal history of other diseases of the nervous system and sense organs: Secondary | ICD-10-CM | POA: Insufficient documentation

## 2012-11-10 DIAGNOSIS — Z9889 Other specified postprocedural states: Secondary | ICD-10-CM | POA: Insufficient documentation

## 2012-11-10 DIAGNOSIS — M545 Low back pain, unspecified: Secondary | ICD-10-CM | POA: Insufficient documentation

## 2012-11-10 DIAGNOSIS — Z79899 Other long term (current) drug therapy: Secondary | ICD-10-CM | POA: Insufficient documentation

## 2012-11-10 DIAGNOSIS — K5900 Constipation, unspecified: Secondary | ICD-10-CM | POA: Insufficient documentation

## 2012-11-10 DIAGNOSIS — M216X9 Other acquired deformities of unspecified foot: Secondary | ICD-10-CM | POA: Insufficient documentation

## 2012-11-10 HISTORY — DX: Calculus of kidney: N20.0

## 2012-11-10 HISTORY — PX: CYSTOSCOPY W/ URETERAL STENT PLACEMENT: SHX1429

## 2012-11-10 LAB — URINE MICROSCOPIC-ADD ON

## 2012-11-10 LAB — CBC WITH DIFFERENTIAL/PLATELET
Basophils Absolute: 0 10*3/uL (ref 0.0–0.1)
Eosinophils Absolute: 0.1 10*3/uL (ref 0.0–0.7)
Eosinophils Relative: 1 % (ref 0–5)
Lymphocytes Relative: 13 % (ref 12–46)
MCH: 32.1 pg (ref 26.0–34.0)
MCV: 90.5 fL (ref 78.0–100.0)
Platelets: 156 10*3/uL (ref 150–400)
RDW: 13 % (ref 11.5–15.5)
WBC: 8.6 10*3/uL (ref 4.0–10.5)

## 2012-11-10 LAB — COMPREHENSIVE METABOLIC PANEL
ALT: 15 U/L (ref 0–53)
AST: 13 U/L (ref 0–37)
Calcium: 8.8 mg/dL (ref 8.4–10.5)
Sodium: 140 mEq/L (ref 135–145)
Total Protein: 6.9 g/dL (ref 6.0–8.3)

## 2012-11-10 LAB — URINALYSIS, ROUTINE W REFLEX MICROSCOPIC
Glucose, UA: NEGATIVE mg/dL
Specific Gravity, Urine: 1.017 (ref 1.005–1.030)

## 2012-11-10 SURGERY — CYSTOSCOPY, WITH RETROGRADE PYELOGRAM AND URETERAL STENT INSERTION
Anesthesia: General | Laterality: Left | Wound class: Clean Contaminated

## 2012-11-10 MED ORDER — CEPHALEXIN 500 MG PO CAPS: 500.0000 mg | ORAL_CAPSULE | Freq: Three times a day (TID) | ORAL | Status: DC

## 2012-11-10 MED ORDER — OXYCODONE-ACETAMINOPHEN 5-325 MG PO TABS: 1.0000 | ORAL_TABLET | ORAL | Status: DC | PRN

## 2012-11-10 MED ORDER — HYDROMORPHONE HCL PF 1 MG/ML IJ SOLN
1.0000 mg | Freq: Once | INTRAMUSCULAR | Status: AC
  Administered 2012-11-10: 1 mg via INTRAVENOUS
  Filled 2012-11-10: qty 1

## 2012-11-10 MED ORDER — LACTATED RINGERS IV SOLN
INTRAVENOUS | Status: DC | PRN
  Administered 2012-11-10: 18:00:00 via INTRAVENOUS

## 2012-11-10 MED ORDER — HYOSCYAMINE SULFATE 0.125 MG PO TABS: 0.1250 mg | ORAL_TABLET | ORAL | Status: DC | PRN

## 2012-11-10 MED ORDER — ONDANSETRON HCL 4 MG/2ML IJ SOLN
4.0000 mg | Freq: Once | INTRAMUSCULAR | Status: AC
  Administered 2012-11-10: 4 mg via INTRAVENOUS
  Filled 2012-11-10: qty 2

## 2012-11-10 MED ORDER — IOHEXOL 300 MG/ML  SOLN
INTRAMUSCULAR | Status: DC | PRN
  Administered 2012-11-10: 8 mL via URETHRAL

## 2012-11-10 MED ORDER — IOHEXOL 300 MG/ML  SOLN: 100.0000 mL | Freq: Once | INTRAMUSCULAR | Status: DC | PRN

## 2012-11-10 MED ORDER — MORPHINE SULFATE 4 MG/ML IJ SOLN
4.0000 mg | Freq: Once | INTRAMUSCULAR | Status: AC
  Administered 2012-11-10: 4 mg via INTRAVENOUS
  Filled 2012-11-10: qty 1

## 2012-11-10 MED ORDER — LIDOCAINE HCL 2 % EX GEL
CUTANEOUS | Status: DC | PRN
  Administered 2012-11-10: 1 via URETHRAL

## 2012-11-10 MED ORDER — OXYCODONE-ACETAMINOPHEN 5-325 MG PO TABS: 1.0000 | ORAL_TABLET | Freq: Four times a day (QID) | ORAL | Status: DC | PRN

## 2012-11-10 MED ORDER — MIDAZOLAM HCL 5 MG/5ML IJ SOLN
INTRAMUSCULAR | Status: DC | PRN
  Administered 2012-11-10: 2 mg via INTRAVENOUS

## 2012-11-10 MED ORDER — IOHEXOL 300 MG/ML  SOLN
25.0000 mL | INTRAMUSCULAR | Status: AC
  Administered 2012-11-10 (×2): 25 mL via ORAL

## 2012-11-10 MED ORDER — IOHEXOL 300 MG/ML  SOLN
INTRAMUSCULAR | Status: AC
  Filled 2012-11-10: qty 1

## 2012-11-10 MED ORDER — ONDANSETRON 4 MG PO TBDP: 4.0000 mg | ORAL_TABLET | Freq: Three times a day (TID) | ORAL | Status: DC | PRN

## 2012-11-10 MED ORDER — BELLADONNA ALKALOIDS-OPIUM 16.2-60 MG RE SUPP
RECTAL | Status: AC
  Filled 2012-11-10: qty 1

## 2012-11-10 MED ORDER — POLYETHYLENE GLYCOL 3350 17 GM/SCOOP PO POWD
17.0000 g | Freq: Every day | ORAL | Status: DC
Start: 2012-11-10 — End: 2012-11-28

## 2012-11-10 MED ORDER — DEXAMETHASONE SODIUM PHOSPHATE 10 MG/ML IJ SOLN
INTRAMUSCULAR | Status: DC | PRN
  Administered 2012-11-10: 10 mg via INTRAVENOUS

## 2012-11-10 MED ORDER — SODIUM CHLORIDE 0.9 % IV BOLUS (SEPSIS)
1000.0000 mL | Freq: Once | INTRAVENOUS | Status: AC
  Administered 2012-11-10: 1000 mL via INTRAVENOUS

## 2012-11-10 MED ORDER — ONDANSETRON HCL 4 MG/2ML IJ SOLN
INTRAMUSCULAR | Status: DC | PRN
  Administered 2012-11-10: 4 mg via INTRAVENOUS

## 2012-11-10 MED ORDER — PHENAZOPYRIDINE HCL 100 MG PO TABS: 100.0000 mg | ORAL_TABLET | Freq: Three times a day (TID) | ORAL | Status: DC | PRN

## 2012-11-10 MED ORDER — CEFAZOLIN SODIUM-DEXTROSE 2-3 GM-% IV SOLR
2.0000 g | Freq: Once | INTRAVENOUS | Status: AC
  Administered 2012-11-10: 2 g via INTRAVENOUS

## 2012-11-10 MED ORDER — SENNOSIDES-DOCUSATE SODIUM 8.6-50 MG PO TABS: 1.0000 | ORAL_TABLET | Freq: Two times a day (BID) | ORAL | Status: DC

## 2012-11-10 MED ORDER — CEFAZOLIN SODIUM-DEXTROSE 2-3 GM-% IV SOLR
INTRAVENOUS | Status: AC
  Filled 2012-11-10: qty 50

## 2012-11-10 MED ORDER — LIDOCAINE HCL 2 % EX GEL
CUTANEOUS | Status: AC
  Filled 2012-11-10: qty 10

## 2012-11-10 MED ORDER — PROPOFOL 10 MG/ML IV BOLUS
INTRAVENOUS | Status: DC | PRN
  Administered 2012-11-10: 200 mg via INTRAVENOUS

## 2012-11-10 MED ORDER — BELLADONNA ALKALOIDS-OPIUM 16.2-60 MG RE SUPP
RECTAL | Status: DC | PRN
  Administered 2012-11-10: 1 via RECTAL

## 2012-11-10 MED ORDER — MINERAL OIL RE ENEM: 1.0000 | ENEMA | Freq: Once | RECTAL | Status: DC

## 2012-11-10 MED ORDER — FENTANYL CITRATE 0.05 MG/ML IJ SOLN
INTRAMUSCULAR | Status: DC | PRN
  Administered 2012-11-10: 50 ug via INTRAVENOUS

## 2012-11-10 MED ORDER — MAGNESIUM CITRATE PO SOLN: 296.0000 mL | Freq: Once | ORAL | Status: DC

## 2012-11-10 MED ORDER — EPHEDRINE SULFATE 50 MG/ML IJ SOLN
INTRAMUSCULAR | Status: DC | PRN
  Administered 2012-11-10: 5 mg via INTRAVENOUS

## 2012-11-10 MED ORDER — SODIUM CHLORIDE 0.9 % IR SOLN
Status: DC | PRN
  Administered 2012-11-10: 3000 mL

## 2012-11-10 SURGICAL SUPPLY — 14 items
ADAPTER CATH URET PLST 4-6FR (CATHETERS) IMPLANT
BAG URO CATCHER STRL LF (DRAPE) ×2 IMPLANT
CATH INTERMIT  6FR 70CM (CATHETERS) ×2 IMPLANT
CLOTH BEACON ORANGE TIMEOUT ST (SAFETY) ×2 IMPLANT
DRAPE CAMERA CLOSED 9X96 (DRAPES) ×2 IMPLANT
GLOVE BIOGEL M 7.0 STRL (GLOVE) ×10 IMPLANT
GOWN PREVENTION PLUS XLARGE (GOWN DISPOSABLE) ×2 IMPLANT
GOWN STRL NON-REIN LRG LVL3 (GOWN DISPOSABLE) ×6 IMPLANT
GUIDEWIRE STR DUAL SENSOR (WIRE) ×2 IMPLANT
MANIFOLD NEPTUNE II (INSTRUMENTS) ×2 IMPLANT
PACK CYSTO (CUSTOM PROCEDURE TRAY) ×2 IMPLANT
SCRUB PCMX 4 OZ (MISCELLANEOUS) ×2 IMPLANT
STENT CONTOUR 6FRX28X.038 (STENTS) ×2 IMPLANT
TUBING CONNECTING 10 (TUBING) ×2 IMPLANT

## 2012-11-10 NOTE — ED Notes (Signed)
Pt. Stated, i feel like I have a blockage in my bowel, I'v not gone to the bathroom since Tuesday.  I've tried enemas, I've drank the stuff for colonoscopies, and notheing

## 2012-11-10 NOTE — Transfer of Care (Signed)
Immediate Anesthesia Transfer of Care Note  Patient: Joshua Bailey  Procedure(s) Performed: Procedure(s): CYSTOSCOPY WITH RETROGRADE PYELOGRAM/URETERAL STENT PLACEMENT (Left)  Patient Location: PACU  Anesthesia Type:General  Level of Consciousness: awake, alert  and oriented  Airway & Oxygen Therapy: Patient Spontanous Breathing and Patient connected to face mask oxygen  Post-op Assessment: Report given to PACU RN  Post vital signs: Reviewed and stable  Complications: No apparent anesthesia complications

## 2012-11-10 NOTE — Preoperative (Signed)
Beta Blockers   Reason not to administer Beta Blockers:Not Applicable 

## 2012-11-10 NOTE — H&P (Addendum)
Urology History and Physical Exam  CC: Left ureter stone. Bilateral nephrolithiasis  HPI:  57 year old male presents today for a left ureter stone. This was associated with left-sided flank pain. It been present for approximately 2 weeks. It is sharp in nature. Is listed with occasional nausea. He's not having gross hematuria. He has a history of stones and he is been able to pass them on his own. He presented to the ER today with continued pain. He could not control his pain with anything other than IV pain medication. A CT scan was obtained today which reveals bilateral nephrolithiasis and a left ureter stone. This stone is partially 8 mm in size. It is the mid ureter. It is associated with proximal hydronephrosis on the left side. He has been n.p.o. since this morning. We discussed management options including attempting to pass the stone versus placement of a left ureter stent. We discussed the risk and benefits as well as likelihood of achieving goals and side effects which include but are not limited to, bleeding, infection, heart attack, stroke, death, pneumonia, inability to place stent, and need for future treatment of the stone.  Today we discussed the management of urinary stones. These options include observation, ureteroscopy, shockwave lithotripsy, and PCNL. We discussed which options are relevant to these particular stones. We discussed the natural history of stones as well as the complications of untreated stones and the impact on quality of life without treatment as well as with each of the above listed treatments. We also discussed the efficacy of each treatment in its ability to clear the stone burden. With any of these management options I discussed the signs and symptoms of infection and the need for emergent treatment should these be experienced. For each option we discussed the ability of each procedure to clear the patient of their stone burden.  For observation I described the  risks which include but are not limited to silent renal damage, life-threatening infection, need for emergent surgery, failure to pass stone, and pain.  For ureteroscopy I described the risks which include heart attack, stroke, pulmonary embolus, death, bleeding, infection, damage to contiguous structures, positioning injury, ureteral stricture, ureteral avulsion, ureteral injury, need for ureteral stent, inability to perform ureteroscopy, need for an interval procedure, inability to clear stone burden, stent discomfort and pain.  For shockwave lithotripsy I described the risks which include arrhythmia, kidney contusion, kidney hemorrhage, need for transfusion, long-term risk of diabetes or hypertension, back discomfort, flank ecchymosis, flank abrasion, inability to break up stone, inability to pass stone fragments, Steinstrasse, infection associated with obstructing stones, need for different surgical procedure, need for repeat shockwave lithotripsy, and death.   PMH: Past Medical History  Diagnosis Date  . Aortic aneurysm, abdominal   . History of nephrolithiasis 06/10/11    "have 2 right now"  . 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 Arthritis     "back and some in my hands"  . Chronic lower back pain   . Chronic knee pain     right; "it never stops hurting"  . Kidney stones     hx multiple kidney stones  . Stroke 2004    acute idiopathic transverse myelitis  . Complication of anesthesia     in 2011 told to be anesthetized thru nose due to teeth being broke out  . Complication of anesthesia     top teeth are glued  in. pt prefers aneth. thru mouth but be very careful of teeth  . Complication of anesthesia     DO NOT BEND RIGHT KNEE PAST 90 DEGREES.    PSH: Past Surgical History  Procedure Laterality Date  . Spine surgery  04/2011    Lateral Internal sphinecterotomy  . Knee surgery   2007; 2008    unsuccessful right knee reconstruction; unsuccessful right knee reconstruction, 2nd time  . Pilonidal cyst / sinus excision  1976  . Laminectomy  06/10/11    "I've had the last 4 lumbar discs removed at different dates "  . Incision and drainage perirectal abscess  ~ 1995  . Anal sphincterotomy  05/05/11  . Endovascular stent insertion  06/18/2011    Procedure: ENDOVASCULAR STENT GRAFT INSERTION;  Surgeon: Pryor Ochoa, MD;  Location: [facility] OR;  Service: Vascular;  Laterality: N/A;  . Back surgery  1980, 1981    lumbar lam  . Right knee reconstruction  4098,1191  . Peridontal osseous surgery    . Aneurysm, aorto-bifemoral bypass      Allergies: No Known Allergies  Medications:  (Not in a hospital admission)   Social History: History   Social History  . Marital Status: Married    Spouse Name: N/A    Number of Children: N/A  . Years of Education: N/A   Occupational History  . Not on file.   Social History Main Topics  . Smoking status: Former Smoker -- 0.50 packs/day for 35 years    Types: Cigarettes    Quit date: 08/24/2012  . Smokeless tobacco: Never Used     Comment: 06/10/11 consult to smoking cessation counselor made  . Alcohol Use: No  . Drug Use: No     Comment: "smoked marijuana in college"  . Sexually Active: Yes   Other Topics Concern  . Not on file   Social History Narrative  . No narrative on file    Family History: Family History  Problem Relation Age of Onset  . Hyperlipidemia Mother   . Heart disease Father   . Hyperlipidemia Father   . Stroke Father   . Peripheral vascular disease Father   . Heart disease Brother   . Hyperlipidemia Brother   . Anesthesia problems Neg Hx   . Hypotension Neg Hx   . Malignant hyperthermia Neg Hx   . Pseudochol deficiency Neg Hx     Review of Systems: Positive: Left flank pain, nausea. Negative: Fever, chest pain, or SOB.  A further 10 point review of systems was negative except what is  listed in the HPI.  Physical Exam:  General: No acute distress.  Awake. Head:  Normocephalic.  Atraumatic. ENT:  EOMI.  Mucous membranes moist Neck:  Supple.  No lymphadenopathy. CV:  S1 present. S2 present. Regular rate. Pulmonary: Equal effort bilaterally.  Clear to auscultation bilaterally. Abdomen: Soft.  Non- tender to palpation. Skin:  Normal turgor.  No visible rash. Extremity: No gross deformity of bilateral upper extremities.  No gross deformity of    bilateral lower extremities. Neurologic: Alert. Appropriate mood.    Studies:  Recent Labs     11/10/12  1154  HGB  15.2  WBC  8.6  PLT  156    Recent Labs     11/10/12  1154  NA  140  K  4.0  CL  103  CO2  26  BUN  22  CREATININE  1.61*  CALCIUM  8.8  GFRNONAA  46*  GFRAA  53*     No results found for this basename: PT, INR, APTT,  in the last 72 hours   No components found with this basename: ABG,     Assessment:  Left ureter stone.  Plan: To OR for cystoscopy, left retrograde pyelogram, left ureter stent placement.  He is interested in proceeding with left-sided ureteroscopy in the future for his stone in the ureter and in the left kidney.

## 2012-11-10 NOTE — ED Provider Notes (Signed)
5:17 PM Pt briefly assessed. Transfer for urology evaluation. Secretary reports Dr Margarita Grizzle aware of arrival. CXR ordered. Pt with no new complaints. Still endorses some abdominal pain. Pain meds ordered.   Raeford Razor, MD 11/10/12 1719

## 2012-11-10 NOTE — Anesthesia Preprocedure Evaluation (Signed)
Anesthesia Evaluation  Patient identified by MRN, date of birth, ID band Patient awake    Reviewed: Allergy & Precautions, H&P , NPO status , Patient's Chart, lab work & pertinent test results  Airway Mallampati: II TM Distance: >3 FB Neck ROM: full    Dental  (+) Dental Advisory Given and Caps Upper teeth glued in:   Pulmonary neg pulmonary ROS,  breath sounds clear to auscultation  Pulmonary exam normal       Cardiovascular + Peripheral Vascular Disease Rhythm:regular Rate:Normal  AAA repair stent   Neuro/Psych 2004 History transverse myelitis with neurological injury negative psych ROS   GI/Hepatic negative GI ROS, Neg liver ROS,   Endo/Other  negative endocrine ROS  Renal/GU negative Renal ROS  negative genitourinary   Musculoskeletal   Abdominal   Peds  Hematology negative hematology ROS (+)   Anesthesia Other Findings   Reproductive/Obstetrics negative OB ROS                           Anesthesia Physical Anesthesia Plan  ASA: III  Anesthesia Plan: General   Post-op Pain Management:    Induction: Intravenous  Airway Management Planned: LMA  Additional Equipment:   Intra-op Plan:   Post-operative Plan:   Informed Consent: I have reviewed the patients History and Physical, chart, labs and discussed the procedure including the risks, benefits and alternatives for the proposed anesthesia with the patient or authorized representative who has indicated his/her understanding and acceptance.   Dental Advisory Given  Plan Discussed with: CRNA and Surgeon  Anesthesia Plan Comments:         Anesthesia Quick Evaluation

## 2012-11-10 NOTE — Op Note (Signed)
Urology Operative Report  Date of Procedure: 11/10/12  Surgeon: Natalia Leatherwood, MD Assistant:  None  Preoperative Diagnosis: Left ureter stone Postoperative Diagnosis:  Same  Procedure(s): Cystoscopy Left ureter stent placement (6x 28) Left retrograde pyelogram  Estimated blood loss: None  Specimen: none  Drains: none  Complications: None  Findings: Patient has what appears to be 2 left-sided ureter stones. His were located in the left ureter. Negative bladder lesions.  History of present illness: 58 year old male presented today with two-week history of left flank pain. CT scan revealed ureter stones in the left mid ureter. He elected to have a left ureter stent placement.   Procedure in detail: After informed consent was obtained, the patient was taken to the operating room. They were placed in the supine position. SCDs were turned on and in place. IV antibiotics were infused, and general anesthesia was induced. A timeout was performed in which the correct patient, surgical site, and procedure were identified and agreed upon by the team.  The patient was placed in a dorsolithotomy position, making sure to pad all pertinent neurovascular pressure points. The genitals were prepped and draped in the usual sterile fashion.  A rigid cystoscope was advanced through the urethra and into the bladder. The bladder was then drained and then fully distended. The entire surface of the bladder was evaluated in a systematic fashion. This was negative for bladder tumors. A 5 French ureter catheter was used to cannulate the left distal ureter and a retrograde PolyGram was obtained. First I obtained fluoroscopy and the stones are not visible on fluoroscopy. A retrograde pyelogram was obtained when I injected contrast. There were 2 filling defects in the left mid ureter consistent with stone seen on CT scan. I then placed a sensor tip wire up the left ureter into the left renal pelvis. I placed a 6  x 28 double-J ureter stent over this with ease. There were no tethering strings in place. There was a good curl in the left renal pelvis and a curl in the bladder. The bladder was drained. I injected 10 cc of lidocaine jelly into the bladder and placed a belladonna and opium suppository into the rectum.  He was placed back in a supine position, anesthesia was reversed, and he was taken to the [facility] in stable condition.  All counts were correct at the end of the case.  He would like to be scheduled for left ureteroscopy for his left ureter stones and left renal stones in the future.

## 2012-11-10 NOTE — ED Notes (Signed)
Pt is sent over from  ed and is to have md woodruff from urology pages. md lockwood sent over from the ed. 20g iv in place pta.

## 2012-11-10 NOTE — ED Provider Notes (Signed)
History     CSN: [id number]  Arrival date & time 11/10/12  1043   First MD Initiated Contact with Patient 11/10/12 1103      Chief Complaint  Patient presents with  . Abdominal Pain    (Consider location/radiation/quality/duration/timing/severity/associated sxs/prior treatment) HPI Comments: Patient with a history of AAA s/p aortic stent graft repair presents today with a chief complaint of generalized abdominal pain.  He reports that he began having the pain three days ago and that the pain has been constant since that time.  Pain gradually worsening.  He also reports that he has not had a regular bowel movement in the past week.  He did had a small watery bowel movement three days ago.  He has tried taking Go Lytely, tried enemas, and also suppositories with no results.  He denies nausea or vomiting.  He denies fever or chills.  He has been urinating regularly.  No urinary symptoms.  He denies prior history of a bowel obstruction.  He has had surgery performed on his AAA, but no other abdominal surgeries.  He had a CT angio abdomen performed on 10/24/12 to assess the AAA.  There was no evidence of a leak or other complications at that time.  The history is provided by the patient.    Past Medical History  Diagnosis Date  . Stroke 2004  . Aortic aneurysm, abdominal   . History of nephrolithiasis 06/10/11    "have 2 right now"  . Complication of anesthesia 06/10/11    "had laser OR on teeth; can not put ETT down; must go thru nose"  . 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 Arthritis     "back and some in my hands"  . Chronic lower back pain   . Chronic knee pain     right; "it never stops hurting"    Past Surgical History  Procedure Laterality Date  . Spine surgery  04/2011    Lateral Internal sphinecterotomy  . Back surgery    . Knee surgery  2007; 2008    unsuccessful right knee  reconstruction; unsuccessful right knee reconstruction, 2nd time  . Pilonidal cyst / sinus excision  1976  . Laminectomy  06/10/11    "I've had the last 4 lumbar discs removed at different dates "  . Incision and drainage perirectal abscess  ~ 1995  . Anal sphincterotomy  05/05/11  . Endovascular stent insertion  06/18/2011    Procedure: ENDOVASCULAR STENT GRAFT INSERTION;  Surgeon: Pryor Ochoa, MD;  Location: [facility] OR;  Service: Vascular;  Laterality: N/A;    Family History  Problem Relation Age of Onset  . Hyperlipidemia Mother   . Heart disease Father   . Hyperlipidemia Father   . Stroke Father   . Peripheral vascular disease Father   . Heart disease Brother   . Hyperlipidemia Brother   . Anesthesia problems Neg Hx   . Hypotension Neg Hx   . Malignant hyperthermia Neg Hx   . Pseudochol deficiency Neg Hx     History  Substance Use Topics  . Smoking status: Former Smoker -- 0.50 packs/day for 35 years    Types: Cigarettes    Quit date: 08/24/2012  . Smokeless tobacco: Never Used     Comment: 06/10/11 consult to smoking cessation counselor made  . Alcohol Use: No      Review of Systems  All  other systems reviewed and are negative.    Allergies  Review of patient's allergies indicates no known allergies.  Home Medications   Current Outpatient Rx  Name  Route  Sig  Dispense  Refill  . ALPRAZolam (XANAX) 1 MG tablet   Oral   Take 2 mg by mouth at bedtime as needed for sleep. To help sleep.         Marland Kitchen aspirin EC 81 MG tablet   Oral   Take 81 mg by mouth every other day.         . chlorhexidine (PERIDEX) 0.12 % solution   Mouth/Throat   Use as directed 15 mLs in the mouth or throat 2 (two) times daily.          . cholecalciferol (VITAMIN D) 1000 UNITS tablet   Oral   Take 2,000 Units by mouth daily.          Marland Kitchen HYDROcodone-acetaminophen (NORCO) 10-325 MG per tablet   Oral   Take 1 tablet by mouth every 6 (six) hours as needed for pain. For pain          . Omega-3 Fatty Acids (FISH OIL) 1200 MG CAPS   Oral   Take 1,200 mg by mouth 2 (two) times daily.            BP 175/95  Pulse 90  Temp(Src) 98 F (36.7 C) (Oral)  Resp 20  SpO2 95%  Physical Exam  Nursing note and vitals reviewed. Constitutional: He appears well-developed and well-nourished. No distress.  HENT:  Head: Normocephalic and atraumatic.  Neck: Normal range of motion. Neck supple.  Cardiovascular: Normal rate, regular rhythm and normal heart sounds.   Pulses:      Dorsalis pedis pulses are 2+ on the right side, and 2+ on the left side.  Pulmonary/Chest: Effort normal and breath sounds normal.  Abdominal: Soft. He exhibits no distension and no mass. Bowel sounds are decreased. There is generalized tenderness. There is no rigidity, no rebound and no guarding.  No pulsatile abdominal mass  Neurological: He is alert.  Skin: Skin is warm and dry. He is not diaphoretic.  Psychiatric: He has a normal mood and affect.    ED Course  Procedures (including critical care time)  Labs Reviewed  CBC WITH DIFFERENTIAL  COMPREHENSIVE METABOLIC PANEL  LIPASE, BLOOD   Ct Abdomen Pelvis Wo Contrast  11/10/2012   **ADDENDUM** CREATED: 11/10/2012 17:41:00  Within the body of the report there is an editing error.  The 3rd paragraph of the report should read:  Both adrenal glands are normal. Bilateral nephrolithiasis identified. The largest stone within the right renal collecting system is in the inferior pole measure 1.1 cm, image 38/series 2. The largest left renal stone is in the upper pole measuring 7 mm, image number 29/series 2. Asymmetric left-sided pelvocaliectasis and hydroureter is noted. Within the mid left ureter there is an 8 mm stone, image 56/series 2. The urinary bladder appears within normal limits. The prostate gland and seminal vesicles are unremarkable.  **END ADDENDUM** SIGNED BY: Rosealee Albee, M.D.  11/10/2012   *RADIOLOGY REPORT*  Clinical Data: Evaluate  for small bowel obstruction  CT ABDOMEN AND PELVIS WITHOUT CONTRAST  Technique:  Multidetector CT imaging of the abdomen and pelvis was performed following the standard protocol without intravenous contrast.  Comparison: 10/24/2012  Findings: The lung bases appear clear.  No pleural or pericardial effusion noted.  No focal liver abnormality identified.  The gallbladder appears within normal  limits.  There is no biliary dilatation.  The pancreas is unremarkable.  Normal appearance of the spleen.  Both adrenal glands are normal.  Bilateral nephrolithiasis identified.  The largest stone within the right renal collecting system is in the inferior pole measure 1.1 cm, image 38/series 2. The largest left renal stone is in the upper pole measuring 7 mm, image number 29/series 2.  Asymmetric left-sided pelvocaliectasis and hydroureter is noted.  Within the mid right ureter there is an 8 mm stone, image 56/series 2.  The urinary bladder appears within normal limits.  The prostate gland and seminal vesicles are unremarkable.  There is a infrarenal abdominal aortic aneurysm which has been repaired with stent graft.  The aneurysm sac measures 4.9 cm in AP dimension, image 48/series 2.  This is stable from previous examination.  There is no upper abdominal adenopathy.  There is no pelvic or inguinal adenopathy.  The stomach is normal.  The small bowel loops have a normal caliber without evidence for obstruction.  The appendix is visualized and appears normal.  Fluid filled colon is identified.  There is multiple distal colonic diverticula but no acute inflammation.  Review of the visualized osseous structures is significant for multilevel spondylosis within the lumbar spine.  IMPRESSION:  1. Left mid ureteral calculus measures 8 mm.  There is associated left-sided hydroureter and hydronephrosis. 2.  Bilateral nephrolithiasis. 3.  No evidence for small bowel obstruction.   Original Report Authenticated By: Signa Kell, M.D.    Dg Chest 2 View  11/10/2012   *RADIOLOGY REPORT*  Clinical Data: Left-sided chest pain.  CHEST - 2 VIEW  Comparison: 06/18/2011.  Findings: The cardiac silhouette, mediastinal and hilar contours are normal and stable.  The lungs are clear.  No pleural effusion. The bony thorax is intact.  IMPRESSION: No acute cardiopulmonary findings.   Original Report Authenticated By: Rudie Meyer, M.D.     No diagnosis found.  Patient discussed with Dr. Jeraldine Loots.  3:40 PM Patient discussed with Dr. Margarita Grizzle with [facility].  He reports that the patient can be transferred to [facility] and have a stent placed.  He requests that the patient go to the ED at [facility] and that he be paged upon arrival.  He also requests an EKG and CXR.  MDM  Patient presents with a chief complaint of constipation and diffuse abdominal pain.  CT ab/pelvis with contrast ordered to rule out obstruction.  CT results as above showing 8 mm calculus of the left mid ureter with associated hydronephrosis and hydroureter.  Creatine slightly elevated at 1.6, which appears to be up from baseline of 0.9.  Patient is afebrile.  UA showed small leukocytes, but was otherwise unremarkable.  Patient discussed with Dr. Margarita Grizzle with Urology.   Patient transferred to [facility] to obtain stent.          Pascal Lux [place], PA-C 11/10/12 1806

## 2012-11-10 NOTE — ED Provider Notes (Signed)
  This was a shared visit with a mid-level provided (NP or PA).  Throughout the patient's course I was available for consultation/collaboration.  I saw the ECG (if appropriate), relevant labs and studies - I agree with the interpretation.  On my exam the patient was in no distress.  He had CT evidence of an mid ureteral stone, and with concern for obstruction causing his pain, he was transferred to our affiliated facility after we discussed this case with our urology colleagues.      Gerhard Munch, MD 11/10/12 1907

## 2012-11-10 NOTE — Anesthesia Postprocedure Evaluation (Signed)
  Anesthesia Post-op Note  Patient: Joshua Bailey  Procedure(s) Performed: Procedure(s) (LRB): CYSTOSCOPY WITH RETROGRADE PYELOGRAM/URETERAL STENT PLACEMENT (Left)  Patient Location: PACU  Anesthesia Type: General  Level of Consciousness: awake and alert   Airway and Oxygen Therapy: Patient Spontanous Breathing  Post-op Pain: mild  Post-op Assessment: Post-op Vital signs reviewed, Patient's Cardiovascular Status Stable, Respiratory Function Stable, Patent Airway and No signs of Nausea or vomiting  Last Vitals:  Filed Vitals:   11/10/12 1915  Temp: 37 C  Resp: 18    Post-op Vital Signs: stable   Complications: No apparent anesthesia complications

## 2012-11-10 NOTE — ED Notes (Signed)
[id number]<AV> Expected date:<BR> Expected time:<BR> Means of arrival:<BR> Comments:<BR> Hold pt from [place] Joshua Bailey to page md wooddroff when pt arrives iv in place 20g

## 2012-11-13 ENCOUNTER — Encounter (HOSPITAL_COMMUNITY): Payer: Self-pay | Admitting: Urology

## 2012-11-13 ENCOUNTER — Other Ambulatory Visit: Payer: Self-pay | Admitting: Urology

## 2012-11-14 LAB — URINE CULTURE

## 2012-11-28 ENCOUNTER — Encounter (HOSPITAL_COMMUNITY): Payer: Self-pay | Admitting: Pharmacy Technician

## 2012-12-06 ENCOUNTER — Encounter (HOSPITAL_COMMUNITY)
Admission: RE | Disposition: A | Discharge status: Home / Self Care | Payer: Self-pay | Source: Ambulatory Visit | Attending: Urology

## 2012-12-06 ENCOUNTER — Encounter (HOSPITAL_COMMUNITY): Payer: Self-pay | Admitting: *Deleted

## 2012-12-06 ENCOUNTER — Encounter (HOSPITAL_COMMUNITY): Payer: Self-pay | Admitting: Anesthesiology

## 2012-12-06 ENCOUNTER — Ambulatory Visit (HOSPITAL_COMMUNITY)
Admission: RE | Admit: 2012-12-06 | Discharge: 2012-12-06 | Disposition: A | Discharge status: Home / Self Care | Payer: Medicare Other | Source: Ambulatory Visit | Attending: Urology | Admitting: Urology

## 2012-12-06 ENCOUNTER — Ambulatory Visit (HOSPITAL_COMMUNITY): Payer: Medicare Other | Admitting: Anesthesiology

## 2012-12-06 DIAGNOSIS — F17200 Nicotine dependence, unspecified, uncomplicated: Secondary | ICD-10-CM | POA: Insufficient documentation

## 2012-12-06 DIAGNOSIS — Z8673 Personal history of transient ischemic attack (TIA), and cerebral infarction without residual deficits: Secondary | ICD-10-CM | POA: Insufficient documentation

## 2012-12-06 DIAGNOSIS — I739 Peripheral vascular disease, unspecified: Secondary | ICD-10-CM | POA: Insufficient documentation

## 2012-12-06 DIAGNOSIS — N200 Calculus of kidney: Secondary | ICD-10-CM | POA: Insufficient documentation

## 2012-12-06 DIAGNOSIS — N201 Calculus of ureter: Secondary | ICD-10-CM | POA: Insufficient documentation

## 2012-12-06 HISTORY — PX: CYSTOSCOPY WITH RETROGRADE PYELOGRAM, URETEROSCOPY AND STENT PLACEMENT: SHX5789

## 2012-12-06 HISTORY — PX: HOLMIUM LASER APPLICATION: SHX5852

## 2012-12-06 SURGERY — CYSTOURETEROSCOPY, WITH RETROGRADE PYELOGRAM AND STENT INSERTION
Anesthesia: General | Laterality: Left | Wound class: Clean Contaminated

## 2012-12-06 MED ORDER — OXYCODONE-ACETAMINOPHEN 5-325 MG PO TABS: 1.0000 | ORAL_TABLET | ORAL | Status: DC | PRN

## 2012-12-06 MED ORDER — IOHEXOL 300 MG/ML  SOLN
INTRAMUSCULAR | Status: AC
  Filled 2012-12-06: qty 1

## 2012-12-06 MED ORDER — DEXAMETHASONE SODIUM PHOSPHATE 4 MG/ML IJ SOLN
INTRAMUSCULAR | Status: DC | PRN
  Administered 2012-12-06: 10 mg via INTRAVENOUS

## 2012-12-06 MED ORDER — ONDANSETRON HCL 4 MG/2ML IJ SOLN
INTRAMUSCULAR | Status: DC | PRN
  Administered 2012-12-06 (×2): 2 mg via INTRAVENOUS

## 2012-12-06 MED ORDER — SODIUM CHLORIDE 0.9 % IR SOLN
Status: DC | PRN
  Administered 2012-12-06: 4000 mL

## 2012-12-06 MED ORDER — LACTATED RINGERS IV SOLN: INTRAVENOUS | Status: DC

## 2012-12-06 MED ORDER — SENNOSIDES-DOCUSATE SODIUM 8.6-50 MG PO TABS: 1.0000 | ORAL_TABLET | Freq: Two times a day (BID) | ORAL | Status: DC

## 2012-12-06 MED ORDER — LIDOCAINE HCL 2 % EX GEL
CUTANEOUS | Status: AC
  Filled 2012-12-06: qty 10

## 2012-12-06 MED ORDER — CEFAZOLIN SODIUM-DEXTROSE 2-3 GM-% IV SOLR
INTRAVENOUS | Status: AC
  Filled 2012-12-06: qty 50

## 2012-12-06 MED ORDER — IOHEXOL 300 MG/ML  SOLN
INTRAMUSCULAR | Status: DC | PRN
  Administered 2012-12-06: 8 mL

## 2012-12-06 MED ORDER — PHENAZOPYRIDINE HCL 100 MG PO TABS
100.0000 mg | ORAL_TABLET | Freq: Once | ORAL | Status: AC
  Administered 2012-12-06: 100 mg via ORAL
  Filled 2012-12-06: qty 1

## 2012-12-06 MED ORDER — MIDAZOLAM HCL 2 MG/2ML IJ SOLN
0.5000 mg | INTRAMUSCULAR | Status: DC | PRN
  Administered 2012-12-06: 0.5 mg via INTRAVENOUS

## 2012-12-06 MED ORDER — HYDROMORPHONE HCL PF 1 MG/ML IJ SOLN
INTRAMUSCULAR | Status: AC
  Filled 2012-12-06: qty 1

## 2012-12-06 MED ORDER — FENTANYL CITRATE 0.05 MG/ML IJ SOLN
25.0000 ug | INTRAMUSCULAR | Status: DC | PRN
  Administered 2012-12-06 (×2): 50 ug via INTRAVENOUS

## 2012-12-06 MED ORDER — OXYBUTYNIN CHLORIDE 5 MG PO TABS: 5.0000 mg | ORAL_TABLET | Freq: Four times a day (QID) | ORAL | Status: DC | PRN

## 2012-12-06 MED ORDER — FENTANYL CITRATE 0.05 MG/ML IJ SOLN
INTRAMUSCULAR | Status: AC
  Filled 2012-12-06: qty 2

## 2012-12-06 MED ORDER — LIDOCAINE HCL (CARDIAC) 20 MG/ML IV SOLN
INTRAVENOUS | Status: DC | PRN
  Administered 2012-12-06: 30 mg via INTRAVENOUS

## 2012-12-06 MED ORDER — BELLADONNA ALKALOIDS-OPIUM 16.2-60 MG RE SUPP
RECTAL | Status: DC | PRN
  Administered 2012-12-06: 1 via RECTAL

## 2012-12-06 MED ORDER — HYDROMORPHONE HCL PF 1 MG/ML IJ SOLN
0.2500 mg | INTRAMUSCULAR | Status: DC | PRN
  Administered 2012-12-06 (×4): 0.5 mg via INTRAVENOUS

## 2012-12-06 MED ORDER — EPHEDRINE SULFATE 50 MG/ML IJ SOLN
INTRAMUSCULAR | Status: DC | PRN
  Administered 2012-12-06: 15 mg via INTRAVENOUS

## 2012-12-06 MED ORDER — MEPERIDINE HCL 50 MG/ML IJ SOLN: 6.2500 mg | INTRAMUSCULAR | Status: DC | PRN

## 2012-12-06 MED ORDER — PROMETHAZINE HCL 25 MG/ML IJ SOLN: 6.2500 mg | INTRAMUSCULAR | Status: DC | PRN

## 2012-12-06 MED ORDER — MIDAZOLAM HCL 2 MG/2ML IJ SOLN
INTRAMUSCULAR | Status: AC
  Filled 2012-12-06: qty 2

## 2012-12-06 MED ORDER — PROPOFOL 10 MG/ML IV BOLUS
INTRAVENOUS | Status: DC | PRN
  Administered 2012-12-06 (×3): 10 mg via INTRAVENOUS
  Administered 2012-12-06: 200 mg via INTRAVENOUS
  Administered 2012-12-06: 5 mg via INTRAVENOUS

## 2012-12-06 MED ORDER — KETOROLAC TROMETHAMINE 30 MG/ML IJ SOLN
15.0000 mg | Freq: Once | INTRAMUSCULAR | Status: AC | PRN
  Administered 2012-12-06: 30 mg via INTRAVENOUS

## 2012-12-06 MED ORDER — CEPHALEXIN 500 MG PO CAPS: 500.0000 mg | ORAL_CAPSULE | Freq: Three times a day (TID) | ORAL | Status: DC

## 2012-12-06 MED ORDER — KETAMINE HCL 10 MG/ML IJ SOLN
INTRAMUSCULAR | Status: DC | PRN
  Administered 2012-12-06: 20 mg via INTRAVENOUS

## 2012-12-06 MED ORDER — CEFAZOLIN SODIUM-DEXTROSE 2-3 GM-% IV SOLR
2.0000 g | INTRAVENOUS | Status: AC
  Administered 2012-12-06: 2 g via INTRAVENOUS

## 2012-12-06 MED ORDER — LACTATED RINGERS IV SOLN
INTRAVENOUS | Status: DC
  Administered 2012-12-06: 16:00:00 via INTRAVENOUS

## 2012-12-06 MED ORDER — OXYCODONE-ACETAMINOPHEN 5-325 MG PO TABS
2.0000 | ORAL_TABLET | Freq: Once | ORAL | Status: AC
  Administered 2012-12-06: 2 via ORAL
  Filled 2012-12-06: qty 2

## 2012-12-06 MED ORDER — MIDAZOLAM HCL 2 MG/2ML IJ SOLN
0.5000 mg | Freq: Once | INTRAMUSCULAR | Status: AC | PRN
  Administered 2012-12-06: 0.5 mg via INTRAVENOUS

## 2012-12-06 MED ORDER — FENTANYL CITRATE 0.05 MG/ML IJ SOLN
INTRAMUSCULAR | Status: DC | PRN
  Administered 2012-12-06 (×5): 25 ug via INTRAVENOUS
  Administered 2012-12-06: 100 ug via INTRAVENOUS
  Administered 2012-12-06: 25 ug via INTRAVENOUS

## 2012-12-06 MED ORDER — KETOROLAC TROMETHAMINE 30 MG/ML IJ SOLN
INTRAMUSCULAR | Status: AC
  Filled 2012-12-06: qty 1

## 2012-12-06 MED ORDER — PHENAZOPYRIDINE HCL 100 MG PO TABS: 100.0000 mg | ORAL_TABLET | Freq: Three times a day (TID) | ORAL | Status: DC | PRN

## 2012-12-06 MED ORDER — HYOSCYAMINE SULFATE 0.125 MG PO TABS: 0.1250 mg | ORAL_TABLET | ORAL | Status: DC | PRN

## 2012-12-06 MED ORDER — BELLADONNA ALKALOIDS-OPIUM 16.2-60 MG RE SUPP
RECTAL | Status: AC
  Filled 2012-12-06: qty 1

## 2012-12-06 MED ORDER — LIDOCAINE HCL 2 % EX GEL
CUTANEOUS | Status: DC | PRN
  Administered 2012-12-06: 1 via URETHRAL

## 2012-12-06 MED ORDER — ACETAMINOPHEN 10 MG/ML IV SOLN
1000.0000 mg | Freq: Once | INTRAVENOUS | Status: DC | PRN
  Filled 2012-12-06: qty 100

## 2012-12-06 MED ORDER — LACTATED RINGERS IV SOLN
INTRAVENOUS | Status: DC | PRN
  Administered 2012-12-06 (×2): via INTRAVENOUS

## 2012-12-06 MED ORDER — MIDAZOLAM HCL 5 MG/5ML IJ SOLN
INTRAMUSCULAR | Status: DC | PRN
  Administered 2012-12-06: 1 mg via INTRAVENOUS

## 2012-12-06 SURGICAL SUPPLY — 37 items
BAG URO CATCHER STRL LF (DRAPE) ×2 IMPLANT
BASKET LASER NITINOL 1.9FR (BASKET) IMPLANT
BASKET STNLS GEMINI 4WIRE 3FR (BASKET) IMPLANT
BASKET ZERO TIP NITINOL 2.4FR (BASKET) ×2 IMPLANT
BRUSH URET BIOPSY 3F (UROLOGICAL SUPPLIES) IMPLANT
CATH CLEAR GEL 3F BACKSTOP (CATHETERS) IMPLANT
CATH URET 5FR 28IN CONE TIP (BALLOONS)
CATH URET 5FR 28IN OPEN ENDED (CATHETERS) ×2 IMPLANT
CATH URET 5FR 70CM CONE TIP (BALLOONS) IMPLANT
CATH URET DUAL LUMEN 6-10FR 50 (CATHETERS) IMPLANT
CLOTH BEACON ORANGE TIMEOUT ST (SAFETY) ×2 IMPLANT
DRAPE CAMERA CLOSED 9X96 (DRAPES) ×2 IMPLANT
FIBER LASER TRAC TIP (UROLOGICAL SUPPLIES) ×2 IMPLANT
GLOVE BIOGEL M 7.0 STRL (GLOVE) IMPLANT
GLOVE ECLIPSE 7.0 STRL STRAW (GLOVE) ×2 IMPLANT
GLOVE SURG SS PI 8.0 STRL IVOR (GLOVE) ×2 IMPLANT
GOWN PREVENTION PLUS LG XLONG (DISPOSABLE) ×2 IMPLANT
GOWN PREVENTION PLUS XLARGE (GOWN DISPOSABLE) ×2 IMPLANT
GOWN STRL REIN XL XLG (GOWN DISPOSABLE) ×2 IMPLANT
GUIDEWIRE ANG ZIPWIRE 038X150 (WIRE) IMPLANT
GUIDEWIRE STR DUAL SENSOR (WIRE) ×6 IMPLANT
IV NS IRRIG 3000ML ARTHROMATIC (IV SOLUTION) ×2 IMPLANT
KIT BALLIN UROMAX 15FX10 (LABEL) IMPLANT
KIT BALLN UROMAX 15FX4 (MISCELLANEOUS) IMPLANT
KIT BALLN UROMAX 26 75X4 (MISCELLANEOUS)
LASER FIBER DISP (UROLOGICAL SUPPLIES) IMPLANT
MANIFOLD NEPTUNE II (INSTRUMENTS) ×2 IMPLANT
MARKER SKIN DUAL TIP RULER LAB (MISCELLANEOUS) ×2 IMPLANT
PACK CYSTO (CUSTOM PROCEDURE TRAY) ×2 IMPLANT
SCRUB PCMX 4 OZ (MISCELLANEOUS) IMPLANT
SET HIGH PRES BAL DIL (LABEL)
SHEATH ACCESS URETERAL 38CM (SHEATH) ×2 IMPLANT
SHEATH URET ACCESS 12FR/35CM (UROLOGICAL SUPPLIES) IMPLANT
SHEATH URET ACCESS 12FR/55CM (UROLOGICAL SUPPLIES) IMPLANT
STENT CONTOUR 6FRX24X.038 (STENTS) ×2 IMPLANT
SYRINGE IRR TOOMEY STRL 70CC (SYRINGE) IMPLANT
TUBING CONNECTING 10 (TUBING) ×2 IMPLANT

## 2012-12-06 NOTE — Transfer of Care (Signed)
Immediate Anesthesia Transfer of Care Note  Patient: Joshua Bailey  Procedure(s) Performed: Procedure(s): CYSTOSCOPY WITH LEFT RETROGRADE PYELOGRAM, LEFT URETEROSCOPY AND LEFT STENT EXCHANGE (Left) HOLMIUM LASER APPLICATION (Left)  Patient Location: PACU  Anesthesia Type:General  Level of Consciousness: awake, alert , oriented and patient cooperative  Airway & Oxygen Therapy: Patient Spontanous Breathing and Patient connected to nasal cannula oxygen  Post-op Assessment: Report given to PACU RN, Post -op Vital signs reviewed and stable, Patient moving all extremities and Patient able to stick tongue midline  Post vital signs: stable  Complications: No apparent anesthesia complications

## 2012-12-06 NOTE — H&P (Signed)
Urology History and Physical Exam  CC: Left ureter stone. Left nephrolithiasis.  HPI:   A 57 year old male presents today for a left ureter stone and nephrolithiasis. He presented with left-sided renal colic and had a left ureter stent placement on 11/10/12. He had a left midureter stone which measured 8 mm in size and 2 mm in size. These are not visible on fluoroscopy. He has bilateral nephrolithiasis. His left kidney contained stones in the upper pole of 8 mm, 2 mm, 3 mm, and 1 mm and also stones in the lower pole of the left measuring 3 mm and 3 mm. We have discussed risks, benefits, alternatives, and the likelihood of achieving goals. He presents today for cystoscopy, left ureteroscopy, laser lithotripsy, possible left retrograde pyelogram, hospital left ureter stent exchange. Urine culture 11/21/12 was negative for growth.  PMH: Past Medical History  Diagnosis Date  . Aortic aneurysm, abdominal   . History of nephrolithiasis 06/10/11    "have 2 right now"  . 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 Arthritis     "back and some in my hands"  . Chronic lower back pain   . Chronic knee pain     right; "it never stops hurting"  . Kidney stones     hx multiple kidney stones  . Stroke 2004    acute idiopathic transverse myelitis  . Complication of anesthesia     in 2011 told to be anesthetized thru nose due to teeth being broke out  . Complication of anesthesia     top teeth are glued in. pt prefers aneth. thru mouth but be very careful of teeth  . Complication of anesthesia     DO NOT BEND RIGHT KNEE PAST 90 DEGREES.    PSH: Past Surgical History  Procedure Laterality Date  . Spine surgery  04/2011    Lateral Internal sphinecterotomy  . Knee surgery  2007; 2008    unsuccessful right knee reconstruction; unsuccessful right knee reconstruction, 2nd time  . Pilonidal cyst / sinus excision   1976  . Laminectomy  06/10/11    "I've had the last 4 lumbar discs removed at different dates "  . Incision and drainage perirectal abscess  ~ 1995  . Anal sphincterotomy  05/05/11  . Endovascular stent insertion  06/18/2011    Procedure: ENDOVASCULAR STENT GRAFT INSERTION;  Surgeon: Pryor Ochoa, MD;  Location: [facility] OR;  Service: Vascular;  Laterality: N/A;  . Back surgery  1980, 1981    lumbar lam  . Right knee reconstruction  [id number],[id number]  . Peridontal osseous surgery    . Aneurysm, aorto-bifemoral bypass    . Cystoscopy w/ ureteral stent placement Left 11/10/2012    Procedure: CYSTOSCOPY WITH RETROGRADE PYELOGRAM/URETERAL STENT PLACEMENT;  Surgeon: Milford Cage, MD;  Location: [facility] ORS;  Service: Urology;  Laterality: Left;    Allergies: No Known Allergies  Medications: No prescriptions prior to admission     Social History: History   Social History  . Marital Status: Married    Spouse Name: N/A    Number of Children: N/A  . Years of Education: N/A   Occupational History  . Not on file.   Social History Main Topics  . Smoking status: Former Smoker -- 0.50 packs/day for 35 years    Types: Cigarettes    Quit date: 08/24/2012  . Smokeless tobacco: Never Used  Comment: 06/10/11 consult to smoking cessation counselor made  . Alcohol Use: No  . Drug Use: No     Comment: "smoked marijuana in college"  . Sexually Active: Yes   Other Topics Concern  . Not on file   Social History Narrative  . No narrative on file    Family History: Family History  Problem Relation Age of Onset  . Hyperlipidemia Mother   . Heart disease Father   . Hyperlipidemia Father   . Stroke Father   . Peripheral vascular disease Father   . Heart disease Brother   . Hyperlipidemia Brother   . Anesthesia problems Neg Hx   . Hypotension Neg Hx   . Malignant hyperthermia Neg Hx   . Pseudochol deficiency Neg Hx     Review of Systems: Positive: Left flank pain. Negative: Fever,  chest pain, or SOB.  A further 10 point review of systems was negative except what is listed in the HPI.  Physical Exam: Filed Vitals:   12/06/12 0945  BP: 147/88  Pulse: 56  Temp: 98.3 F (36.8 C)  Resp: 18    General: No acute distress.  Awake. Head:  Normocephalic.  Atraumatic. ENT:  EOMI.  Mucous membranes moist Neck:  Supple.  No lymphadenopathy. CV:  S1 present. S2 present. Regular rate. Pulmonary: Equal effort bilaterally.  Clear to auscultation bilaterally. Abdomen: Soft.  Non- tender to palpation. Skin:  Normal turgor.  No visible rash. Extremity: No gross deformity of bilateral upper extremities.  No gross deformity of    bilateral lower extremities. Neurologic: Alert. Appropriate mood.  .  Studies:  No results found for this basename: HGB, WBC, PLT,  in the last 72 hours  No results found for this basename: NA, K, CL, CO2, BUN, CREATININE, CALCIUM, MAGNESIUM, GFRNONAA, GFRAA,  in the last 72 hours   No results found for this basename: PT, INR, APTT,  in the last 72 hours   No components found with this basename: ABG,     Assessment:  Left ureter stone. Left nephrolithiasis.  Plan: To OR for cystoscopy, left ureteroscopy, laser lithotripsy, possible left retrograde pyelogram, hospital left ureter stent exchange.

## 2012-12-06 NOTE — Op Note (Signed)
Urology Operative Report  Date of Procedure: 12/06/12  Surgeon: Natalia Leatherwood, MD Assistant:  None  Preoperative Diagnosis: Left ureter stones. Left nephrolithiasis. Postoperative Diagnosis:  Same  Procedure(s): Cystoscopy Left ureteroscopy Laser lithotripsy (2 separate areas: left ureter and left kidney) Basket stone retrieval Left retrograde pyelogram  with interpretation Left ureter stent removal Left ureter stent placement (6 x 24 w/ tether)   Estimated blood loss: Minimal  Specimen: Stones sent to [facility] lab for chemical analysis.  Drains: None  Complications: None  Findings: 2 large left mid ureter stones. Several large left kidney stones. Greatest size was approximately 1 cm. Negative extravasation or filling defects on retrograde pyelogram on the left.  History of present illness: 57 year old male presents today for ureteroscopy for left ureter stones as well as a left kidney stones. He had symptomatic left ureter stones which required ureter stent placement. We plan for ureteroscopy to manage the stones and he elected to proceed with lithotripsy of the stones in his left kidney during ureteroscopy as well.    Procedure in detail: After informed consent was obtained, the patient was taken to the operating room. They were placed in the supine position. SCDs were turned on and in place. IV antibiotics were infused, and general anesthesia was induced. A timeout was performed in which the correct patient, surgical site, and procedure were identified and agreed upon by the team.  The patient was placed in a dorsolithotomy position, making sure to pad all pertinent neurovascular pressure points. The genitals were prepped and draped in the usual sterile fashion.  A rigid cystoscope was advanced through the urethra and into the bladder. A sensor tip wire was placed into the left ureter orifice and placed up into the left renal pelvis beside the left ureter stent. Stent grasper  was then used to grasp the left ureter stent and it was brought to the urethra meatus where a second sensor-tip wire was placed up the stent and into the left renal pelvis on fluoroscopy. One wire was secured as a safety wire while the other was used as a working wire.  A 12/14 digital ureter access sheath was placed under fluoroscopy over the working wire into the distal ureter. The obturator and wire were removed. Flexible digital ureter scope was then placed through the ureter on the left and to the mid ureter were large stones were encountered. I performed lithotripsy with a 200  holmium laser filament at a setting of 0.5 J and 20 Hz. Once the fragments were small enough to be removed there were removed with a 0 tip Nitinol basket. A second large stone in the left ureter was encountered and likewise lithotripsy was performed with the holmium laser filament. This was fragmented and the pieces were removed with a Nitinol basket. I then navigated the ureteroscope into the left renal pelvis. I placed a second sensor wire through the ureter scope and backloaded this off along with the access sheath.   I then placed the access sheath with the obturator over the working wire under fluoroscopy into the left proximal ureter. The obturator and working wire were removed and the digital ureter scope was placed up the access sheath and into the left proximal ureter. I was able to navigate into the kidney and evaluated each calyx in the kidney. There were noted to be several stones in the lower pole and upper pole of the kidney. Holmium laser was used to fragment the stones and the pieces were removed. The largest of  the stones was approximately 1 cm in size. After this was done the remainder of the kidney was again evaluated and found to be free of stones. There were noted to be numerous Randall's plaques.  I withdrew the ureter scope and visualized the entire length of the left ureter. There was no disruption of the  mucosa. I then placed the rigid cystoscope back into the bladder and cannulated the left ureter orifice with a 5 French ureter catheter. I injected contrast to obtain a left retrograde pyelogram. There were no filling defects, extravasation, or obstruction noted. The safety wire was then backloaded through the cystoscope and I placed a 6 x 24 double-J ureter stent up the wire under fluoroscopy with ease leaving a tether in place. The bladder was drained. I placed 10 cc of lidocaine jelly into the urethra and placed a belladonna and opium suppository into the rectum. Rectal exam revealed a prostate which was approximately 35-40 g in size without nodules. No rectal masses noted.  He was placed back in a supine position. The tether was secured to his penis. Anesthesia was reversed and he was taken to the PACU in stable condition.  All counts were correct at the end of the case.  He will remove the stent with the string this coming Friday.

## 2012-12-06 NOTE — Anesthesia Preprocedure Evaluation (Signed)
Anesthesia Evaluation  Patient identified by MRN, date of birth, ID band Patient awake    Reviewed: Allergy & Precautions, H&P , NPO status , Patient's Chart, lab work & pertinent test results  Airway Mallampati: II TM Distance: >3 FB Neck ROM: full    Dental  (+) Dental Advisory Given and Caps Upper teeth glued in:   Pulmonary neg pulmonary ROS, Current Smoker,  breath sounds clear to auscultation  Pulmonary exam normal       Cardiovascular + Peripheral Vascular Disease Rhythm:regular Rate:Normal  AAA repair stent   Neuro/Psych 2004 History transverse myelitis with neurological injury CVA negative psych ROS   GI/Hepatic negative GI ROS, Neg liver ROS,   Endo/Other  negative endocrine ROS  Renal/GU negative Renal ROS  negative genitourinary   Musculoskeletal   Abdominal   Peds  Hematology negative hematology ROS (+)   Anesthesia Other Findings Upper front teeth capped/bridge. H/o previous dental injury during GA at different institution  Reproductive/Obstetrics negative OB ROS                           Anesthesia Physical  Anesthesia Plan  ASA: III  Anesthesia Plan: General   Post-op Pain Management:    Induction: Intravenous  Airway Management Planned: LMA  Additional Equipment:   Intra-op Plan:   Post-operative Plan:   Informed Consent: I have reviewed the patients History and Physical, chart, labs and discussed the procedure including the risks, benefits and alternatives for the proposed anesthesia with the patient or authorized representative who has indicated his/her understanding and acceptance.   Dental Advisory Given and Dental advisory given  Plan Discussed with: CRNA and Surgeon  Anesthesia Plan Comments: (Risk of dental injury discussed)        Anesthesia Quick Evaluation

## 2012-12-06 NOTE — Anesthesia Postprocedure Evaluation (Signed)
  Anesthesia Post-op Note  Patient: Joshua Bailey  Procedure(s) Performed: Procedure(s) (LRB): CYSTOSCOPY WITH LEFT RETROGRADE PYELOGRAM, LEFT URETEROSCOPY AND LEFT STENT EXCHANGE (Left) HOLMIUM LASER APPLICATION (Left)  Patient Location: PACU  Anesthesia Type: General  Level of Consciousness: awake and alert   Airway and Oxygen Therapy: Patient Spontanous Breathing  Post-op Pain: mild  Post-op Assessment: Post-op Vital signs reviewed, Patient's Cardiovascular Status Stable, Respiratory Function Stable, Patent Airway and No signs of Nausea or vomiting  Last Vitals:  Filed Vitals:   12/06/12 1555  BP: 124/71  Pulse: 50  Temp: 36.6 C  Resp: 14    Post-op Vital Signs: stable   Complications: No apparent anesthesia complications

## 2012-12-07 ENCOUNTER — Encounter (HOSPITAL_COMMUNITY): Payer: Self-pay | Admitting: Urology

## 2013-03-12 ENCOUNTER — Other Ambulatory Visit: Payer: Self-pay | Admitting: Urology

## 2013-03-19 ENCOUNTER — Encounter (HOSPITAL_COMMUNITY): Payer: Self-pay | Admitting: Pharmacy Technician

## 2013-03-20 ENCOUNTER — Other Ambulatory Visit (HOSPITAL_COMMUNITY): Payer: Self-pay | Admitting: *Deleted

## 2013-03-20 NOTE — Patient Instructions (Addendum)
20      Your procedure is scheduled on:  Tuesday 03/27/2013  Report to [facility] at 0645  AM.  Call this number if you have problems the night before or morning of surgery:  [phone]   Remember:             IF YOU USE CPAP,BRING MASK AND TUBING AM OF SURGERY!   Do not eat food or drink liquids AFTER MIDNIGHT!  Take these medicines the morning of surgery with A SIP OF WATER: NONE   Do not bring valuables to the hospital. [facility] IS NOT RESPONSIBLE FOR ANY BELONGINGS OR VALUABLES BROUGHT TO HOSPITAL.  Marland Kitchen  Leave suitcase in the car. After surgery it may be brought to your room.  For patients admitted to the hospital, checkout time is 11:00 AM the day of              Discharge.    DO NOT WEAR JEWELRY , MAKE-UP, LOTIONS,POWDERS,PERFUMES!             WOMEN -DO NOT SHAVE LEGS OR UNDERARMS 12 HRS. BEFORE SURGERY!               MEN MAY SHAVE AS USUAL!             CONTACTS,DENTURES OR BRIDGEWORK, FALSE EYELASHES MAY NOT BE WORN INTO SURGERY!                                           Patients discharged the day of surgery will not be allowed to drive home. If going home the same day of surgery, must have someone stay with you first 24 hrs.at home and arrange for someone to drive you home from the Hospital.                          YOUR DRIVER AV:[id number]   Special Instructions:             Please read over the following fact sheets that you were given:             1. [facility] PREPARING FOR SURGERY SHEET              2.[facility] [employer]                                        Telford Nab.Nicki Gracy,RN,BSN     [phone]                FAILURE TO FOLLOW THESE INSTRUCTIONS MAY RESULT IN CANCELLATION OF YOUR SURGERY!               Patient Signature:___________________________

## 2013-03-21 ENCOUNTER — Encounter (HOSPITAL_COMMUNITY)
Admission: RE | Admit: 2013-03-21 | Discharge: 2013-03-21 | Disposition: A | Discharge status: Home / Self Care | Payer: Medicare Other | Source: Ambulatory Visit | Attending: Urology | Admitting: Urology

## 2013-03-21 ENCOUNTER — Encounter (HOSPITAL_COMMUNITY): Payer: Self-pay

## 2013-03-21 DIAGNOSIS — N200 Calculus of kidney: Secondary | ICD-10-CM | POA: Insufficient documentation

## 2013-03-21 DIAGNOSIS — I739 Peripheral vascular disease, unspecified: Secondary | ICD-10-CM | POA: Insufficient documentation

## 2013-03-21 DIAGNOSIS — Z8673 Personal history of transient ischemic attack (TIA), and cerebral infarction without residual deficits: Secondary | ICD-10-CM | POA: Insufficient documentation

## 2013-03-21 DIAGNOSIS — Z01812 Encounter for preprocedural laboratory examination: Secondary | ICD-10-CM | POA: Insufficient documentation

## 2013-03-21 LAB — CBC
HCT: 50.4 % (ref 39.0–52.0)
MCHC: 34.1 g/dL (ref 30.0–36.0)
Platelets: 202 10*3/uL (ref 150–400)
RDW: 12.8 % (ref 11.5–15.5)

## 2013-03-21 LAB — BASIC METABOLIC PANEL
BUN: 18 mg/dL (ref 6–23)
Calcium: 9.8 mg/dL (ref 8.4–10.5)
Chloride: 103 mEq/L (ref 96–112)
Creatinine, Ser: 1.04 mg/dL (ref 0.50–1.35)
GFR calc Af Amer: >90 mL/min (ref >90)
GFR calc non Af Amer: 78 mL/min — ABNORMAL LOW (ref >90)

## 2013-03-21 NOTE — Progress Notes (Signed)
Lab Labs from [facility] from 03/01/2013 on chart.

## 2013-03-27 ENCOUNTER — Ambulatory Visit (HOSPITAL_COMMUNITY): Payer: Medicare Other | Admitting: Anesthesiology

## 2013-03-27 ENCOUNTER — Ambulatory Visit (HOSPITAL_COMMUNITY)
Admission: RE | Admit: 2013-03-27 | Discharge: 2013-03-27 | Disposition: A | Discharge status: Home / Self Care | Payer: Medicare Other | Source: Ambulatory Visit | Attending: Urology | Admitting: Urology

## 2013-03-27 ENCOUNTER — Encounter (HOSPITAL_COMMUNITY)
Admission: RE | Disposition: A | Discharge status: Home / Self Care | Payer: Self-pay | Source: Ambulatory Visit | Attending: Urology

## 2013-03-27 ENCOUNTER — Encounter (HOSPITAL_COMMUNITY): Payer: Self-pay | Admitting: *Deleted

## 2013-03-27 ENCOUNTER — Encounter (HOSPITAL_COMMUNITY): Payer: Medicare Other | Admitting: Anesthesiology

## 2013-03-27 DIAGNOSIS — I714 Abdominal aortic aneurysm, without rupture, unspecified: Secondary | ICD-10-CM | POA: Insufficient documentation

## 2013-03-27 DIAGNOSIS — M19049 Primary osteoarthritis, unspecified hand: Secondary | ICD-10-CM | POA: Insufficient documentation

## 2013-03-27 DIAGNOSIS — N200 Calculus of kidney: Secondary | ICD-10-CM

## 2013-03-27 DIAGNOSIS — Z8673 Personal history of transient ischemic attack (TIA), and cerebral infarction without residual deficits: Secondary | ICD-10-CM | POA: Insufficient documentation

## 2013-03-27 DIAGNOSIS — Z7982 Long term (current) use of aspirin: Secondary | ICD-10-CM | POA: Insufficient documentation

## 2013-03-27 DIAGNOSIS — G0489 Other myelitis: Secondary | ICD-10-CM | POA: Insufficient documentation

## 2013-03-27 DIAGNOSIS — M479 Spondylosis, unspecified: Secondary | ICD-10-CM | POA: Insufficient documentation

## 2013-03-27 DIAGNOSIS — N201 Calculus of ureter: Secondary | ICD-10-CM | POA: Insufficient documentation

## 2013-03-27 HISTORY — PX: HOLMIUM LASER APPLICATION: SHX5852

## 2013-03-27 HISTORY — PX: CYSTOSCOPY WITH RETROGRADE PYELOGRAM, URETEROSCOPY AND STENT PLACEMENT: SHX5789

## 2013-03-27 SURGERY — CYSTOURETEROSCOPY, WITH RETROGRADE PYELOGRAM AND STENT INSERTION
Anesthesia: General | Laterality: Right | Wound class: Clean Contaminated

## 2013-03-27 MED ORDER — LIDOCAINE HCL 2 % EX GEL
CUTANEOUS | Status: AC
  Filled 2013-03-27: qty 10

## 2013-03-27 MED ORDER — LACTATED RINGERS IV SOLN
INTRAVENOUS | Status: DC
  Administered 2013-03-27: 13:00:00 via INTRAVENOUS
  Administered 2013-03-27: 1000 mL via INTRAVENOUS
  Administered 2013-03-27: 11:00:00 via INTRAVENOUS

## 2013-03-27 MED ORDER — OXYCODONE HCL 5 MG/5ML PO SOLN
5.0000 mg | Freq: Once | ORAL | Status: AC | PRN
  Filled 2013-03-27: qty 5

## 2013-03-27 MED ORDER — PROMETHAZINE HCL 25 MG/ML IJ SOLN: 6.2500 mg | INTRAMUSCULAR | Status: DC | PRN

## 2013-03-27 MED ORDER — FENTANYL CITRATE 0.05 MG/ML IJ SOLN
INTRAMUSCULAR | Status: AC
  Filled 2013-03-27: qty 2

## 2013-03-27 MED ORDER — SODIUM CHLORIDE 0.9 % IR SOLN
Status: DC | PRN
  Administered 2013-03-27: 3000 mL

## 2013-03-27 MED ORDER — FENTANYL CITRATE 0.05 MG/ML IJ SOLN
50.0000 ug | INTRAMUSCULAR | Status: DC | PRN
  Administered 2013-03-27 (×2): 50 ug via INTRAVENOUS

## 2013-03-27 MED ORDER — PHENAZOPYRIDINE HCL 200 MG PO TABS: 200.0000 mg | ORAL_TABLET | Freq: Three times a day (TID) | ORAL | Status: DC | PRN

## 2013-03-27 MED ORDER — BELLADONNA ALKALOIDS-OPIUM 16.2-60 MG RE SUPP
RECTAL | Status: AC
  Filled 2013-03-27: qty 1

## 2013-03-27 MED ORDER — 0.9 % SODIUM CHLORIDE (POUR BTL) OPTIME
TOPICAL | Status: DC | PRN
  Administered 2013-03-27: 1000 mL

## 2013-03-27 MED ORDER — PHENAZOPYRIDINE HCL 200 MG PO TABS
200.0000 mg | ORAL_TABLET | Freq: Three times a day (TID) | ORAL | Status: DC
  Administered 2013-03-27: 200 mg via ORAL
  Filled 2013-03-27: qty 1

## 2013-03-27 MED ORDER — IOHEXOL 300 MG/ML  SOLN
INTRAMUSCULAR | Status: DC | PRN
  Administered 2013-03-27: 10 mL

## 2013-03-27 MED ORDER — EPHEDRINE SULFATE 50 MG/ML IJ SOLN
INTRAMUSCULAR | Status: DC | PRN
  Administered 2013-03-27 (×2): 5 mg via INTRAVENOUS

## 2013-03-27 MED ORDER — FENTANYL CITRATE 0.05 MG/ML IJ SOLN
INTRAMUSCULAR | Status: DC | PRN
  Administered 2013-03-27 (×3): 50 ug via INTRAVENOUS
  Administered 2013-03-27: 100 ug via INTRAVENOUS

## 2013-03-27 MED ORDER — HYDROMORPHONE HCL PF 1 MG/ML IJ SOLN: 0.2500 mg | INTRAMUSCULAR | Status: DC | PRN

## 2013-03-27 MED ORDER — LIDOCAINE HCL 2 % EX GEL
CUTANEOUS | Status: DC | PRN
  Administered 2013-03-27: 1 via URETHRAL

## 2013-03-27 MED ORDER — CEFAZOLIN SODIUM-DEXTROSE 2-3 GM-% IV SOLR
2.0000 g | INTRAVENOUS | Status: AC
  Administered 2013-03-27: 2 g via INTRAVENOUS

## 2013-03-27 MED ORDER — PROPOFOL 10 MG/ML IV BOLUS
INTRAVENOUS | Status: DC | PRN
  Administered 2013-03-27: 200 mg via INTRAVENOUS

## 2013-03-27 MED ORDER — ONDANSETRON HCL 4 MG/2ML IJ SOLN
INTRAMUSCULAR | Status: DC | PRN
  Administered 2013-03-27 (×2): 2 mg via INTRAVENOUS

## 2013-03-27 MED ORDER — CEPHALEXIN 500 MG PO CAPS: 500.0000 mg | ORAL_CAPSULE | Freq: Three times a day (TID) | ORAL | Status: DC

## 2013-03-27 MED ORDER — CEFAZOLIN SODIUM-DEXTROSE 2-3 GM-% IV SOLR
INTRAVENOUS | Status: AC
  Filled 2013-03-27: qty 50

## 2013-03-27 MED ORDER — MIDAZOLAM HCL 5 MG/5ML IJ SOLN
INTRAMUSCULAR | Status: DC | PRN
  Administered 2013-03-27 (×2): 1 mg via INTRAVENOUS

## 2013-03-27 MED ORDER — SENNOSIDES-DOCUSATE SODIUM 8.6-50 MG PO TABS: 1.0000 | ORAL_TABLET | Freq: Two times a day (BID) | ORAL | Status: DC

## 2013-03-27 MED ORDER — OXYCODONE HCL 5 MG PO TABS
5.0000 mg | ORAL_TABLET | Freq: Once | ORAL | Status: AC | PRN
  Administered 2013-03-27: 5 mg via ORAL
  Filled 2013-03-27: qty 1

## 2013-03-27 MED ORDER — BELLADONNA ALKALOIDS-OPIUM 16.2-60 MG RE SUPP
RECTAL | Status: DC | PRN
  Administered 2013-03-27: 1 via RECTAL

## 2013-03-27 MED ORDER — MEPERIDINE HCL 50 MG/ML IJ SOLN: 6.2500 mg | INTRAMUSCULAR | Status: DC | PRN

## 2013-03-27 MED ORDER — LIDOCAINE HCL (CARDIAC) 20 MG/ML IV SOLN
INTRAVENOUS | Status: DC | PRN
  Administered 2013-03-27: 75 mg via INTRAVENOUS

## 2013-03-27 MED ORDER — OXYCODONE-ACETAMINOPHEN 5-325 MG PO TABS: 1.0000 | ORAL_TABLET | ORAL | Status: DC | PRN

## 2013-03-27 MED ORDER — HYDROMORPHONE HCL PF 1 MG/ML IJ SOLN
INTRAMUSCULAR | Status: DC | PRN
  Administered 2013-03-27: 2 mg via INTRAVENOUS

## 2013-03-27 SURGICAL SUPPLY — 35 items
BAG URO CATCHER STRL LF (DRAPE) ×2 IMPLANT
BASKET LASER NITINOL 1.9FR (BASKET) IMPLANT
BASKET STNLS GEMINI 4WIRE 3FR (BASKET) IMPLANT
BASKET ZERO TIP NITINOL 2.4FR (BASKET) ×2 IMPLANT
BRUSH URET BIOPSY 3F (UROLOGICAL SUPPLIES) IMPLANT
CATH CLEAR GEL 3F BACKSTOP (CATHETERS) IMPLANT
CATH URET 5FR 28IN CONE TIP (BALLOONS)
CATH URET 5FR 28IN OPEN ENDED (CATHETERS) ×2 IMPLANT
CATH URET 5FR 70CM CONE TIP (BALLOONS) IMPLANT
CATH URET DUAL LUMEN 6-10FR 50 (CATHETERS) IMPLANT
CLOTH BEACON ORANGE TIMEOUT ST (SAFETY) ×2 IMPLANT
DRAPE CAMERA CLOSED 9X96 (DRAPES) ×2 IMPLANT
FIBER LASER FLEXIVA 200 (UROLOGICAL SUPPLIES) ×2 IMPLANT
FIBER LASER FLEXIVA 365 (UROLOGICAL SUPPLIES) IMPLANT
GLOVE BIOGEL M 7.0 STRL (GLOVE) IMPLANT
GLOVE ECLIPSE 7.0 STRL STRAW (GLOVE) ×2 IMPLANT
GOWN PREVENTION PLUS LG XLONG (DISPOSABLE) ×2 IMPLANT
GOWN STRL REIN XL XLG (GOWN DISPOSABLE) ×4 IMPLANT
GUIDEWIRE ANG ZIPWIRE 038X150 (WIRE) IMPLANT
GUIDEWIRE STR DUAL SENSOR (WIRE) ×4 IMPLANT
IV NS IRRIG 3000ML ARTHROMATIC (IV SOLUTION) ×2 IMPLANT
KIT BALLIN UROMAX 15FX10 (LABEL) IMPLANT
KIT BALLN UROMAX 15FX4 (MISCELLANEOUS) IMPLANT
KIT BALLN UROMAX 26 75X4 (MISCELLANEOUS)
MANIFOLD NEPTUNE II (INSTRUMENTS) ×2 IMPLANT
PACK CYSTO (CUSTOM PROCEDURE TRAY) ×2 IMPLANT
SCRUB PCMX 4 OZ (MISCELLANEOUS) IMPLANT
SET HIGH PRES BAL DIL (LABEL)
SHEATH ACCESS URETERAL 38CM (SHEATH) IMPLANT
SHEATH ACCESS URETERAL 54CM (SHEATH) ×2 IMPLANT
SHEATH URET ACCESS 12FR/35CM (UROLOGICAL SUPPLIES) IMPLANT
SHEATH URET ACCESS 12FR/55CM (UROLOGICAL SUPPLIES) IMPLANT
STENT POLARIS 5FRX28 (STENTS) ×2 IMPLANT
SYRINGE IRR TOOMEY STRL 70CC (SYRINGE) IMPLANT
TUBING CONNECTING 10 (TUBING) ×2 IMPLANT

## 2013-03-27 NOTE — Progress Notes (Signed)
Passed a stone Sunday night.

## 2013-03-27 NOTE — Anesthesia Procedure Notes (Signed)
Procedure Name: LMA Insertion Date/Time: 03/27/2013 10:38 AM Performed by: Edison Pace Pre-anesthesia Checklist: Patient identified, Timeout performed, Emergency Drugs available, Suction available and Patient being monitored Patient Re-evaluated:Patient Re-evaluated prior to inductionOxygen Delivery Method: Circle system utilized Preoxygenation: Pre-oxygenation with 100% oxygen Intubation Type: IV induction LMA: LMA with gastric port inserted LMA Size: 5.0 Number of attempts: 1 Tube secured with: Tape Dental Injury: Teeth and Oropharynx as per pre-operative assessment

## 2013-03-27 NOTE — Op Note (Signed)
Urology Operative Report  Date of Procedure: 03/27/13  Surgeon: Natalia Leatherwood, MD Assistant:  None  Preoperative Diagnosis: Right ureter stone. Right nephrolithiasis. Postoperative Diagnosis: Right nephrolithiasis.  Procedure(s): Right ureteroscopy with laser lithotripsy and stone removal. Cystoscopy Right ureter stent placement (5 x 28)  Estimated blood loss: Minimal  Specimen: Stones provided to family.  Drains: None.  Complications: None  Findings: Multiple right renal stones. Negative right ureter stone.  History of present illness: 57 year old male presents for right nephrolithiasis. He also a right ureter stone which he thinks he passed right worse procedure. He is a large stone burden and presents for ureteroscopy for that disease.   Procedure in detail: After informed consent was obtained, the patient was taken to the operating room. They were placed in the supine position. SCDs were turned on and in place. IV antibiotics were infused, and general anesthesia was induced. A timeout was performed in which the correct patient, surgical site, and procedure were identified and agreed upon by the team.  The patient was placed in a dorsolithotomy position, making sure to pad all pertinent neurovascular pressure points. The genitals were prepped and draped in the usual sterile fashion.  A rigid cystoscope was advanced through the urethra and into the bladder. The bladder was drained and then fully distended and evaluated in a systematic fashion making sure to visualize the entire surface of the bladder. This was negative for bladder tumors. Attention was turned to the right ureter orifice. It was cannulated with 2 sensor tip wires which were placed into the right renal pelvis under fluoroscopy. Lumbar was used as a safety wire that was used as a working wire. Next I used an 11-13 [place] ureter access sheath by first placing the obturator over the wire under fluoroscopy and then  the obturator and sheath. The sheath was not long enough to reach the renal pelvis I was able to navigate the flexible visual ureteroscope up into the kidney. I then withdrew the scope and access sheath and visualized the entire ureter. This is wide open without any stones. There was no injury to the ureter noted. I therefore placed a second sensor wire again and placed a 12/14 ureter access sheath over the wire under fluoroscopy into the right renal pelvis. The obturator and working wire were removed. I was unable to navigate the flexible digital ureter scope into the kidney and visualized all portions of the kidney. There were multiple stones noted throughout the kidney. I removed the stones which were able to be removed with a 0 tip Nitinol basket. I then perform lithotripsy of the remaining stones. A large stone in the lower pole and into the upper pole and perform lithotripsy they are. All stone fragments were removed which were large enough to be grasped with a 0 tip Nitinol basket. There were some remaining stone fragments which were smaller than the size of the sensor wire. After this was complete the entire length of the right ureter was visualized while removing the ureter scope and the access sheath. There was no injury to the ureter. Due to placing a ureter access sheath I felt it would be appropriate to leave a ureter stent for some period, and therefore the safety wire was threaded to the cystoscope and a right ureter stent was placed using a 5 x 28 pole there is stent with the tethering string. This was deployed with good, right renal pelvis and in the bladder. The bladder was then emptied and the sheath was removed  to the cystoscope. I placed 10 cc of lidocaine jelly into the urethra and a B. and O. suppository into the rectum.  This completed the procedure. He's placed in a supine position, anesthesia was reversed, and he was taken to PACU stable condition.  He'll remove his stent is coming  Friday.  All counts were correct at the end of the case.

## 2013-03-27 NOTE — Preoperative (Addendum)
Beta Blockers   Reason not to administer Beta Blockers:Not Applicable 

## 2013-03-27 NOTE — Anesthesia Postprocedure Evaluation (Signed)
  Anesthesia Post-op Note  Patient: Joshua Bailey  Procedure(s) Performed: Procedure(s) (LRB): CYSTOSCOPY , URETEROSCOPY AND STENT PLACEMENT (Right) HOLMIUM LASER APPLICATION (Right)  Patient Location: PACU  Anesthesia Type: General  Level of Consciousness: awake and alert   Airway and Oxygen Therapy: Patient Spontanous Breathing  Post-op Pain: mild  Post-op Assessment: Post-op Vital signs reviewed, Patient's Cardiovascular Status Stable, Respiratory Function Stable, Patent Airway and No signs of Nausea or vomiting  Last Vitals:  Filed Vitals:   03/27/13 1407  BP: 126/67  Pulse: 55  Temp: 36.8 C  Resp: 16    Post-op Vital Signs: stable   Complications: No apparent anesthesia complications

## 2013-03-27 NOTE — Transfer of Care (Signed)
Immediate Anesthesia Transfer of Care Note  Patient: Joshua Bailey  Procedure(s) Performed: Procedure(s): CYSTOSCOPY , URETEROSCOPY AND STENT PLACEMENT (Right) HOLMIUM LASER APPLICATION (Right)  Patient Location: PACU  Anesthesia Type:General  Level of Consciousness: awake, alert , oriented, patient cooperative and responds to stimulation  Airway & Oxygen Therapy: Patient Spontanous Breathing and Patient connected to face mask oxygen  Post-op Assessment: Report given to PACU RN, Post -op Vital signs reviewed and stable and Patient moving all extremities  Post vital signs: Reviewed and stable  Complications: No apparent anesthesia complications

## 2013-03-27 NOTE — H&P (Signed)
Urology History and Physical Exam  CC: Right nephrolithiasis. Right ureter stone.  HPI: 57 year old male presents with right nephrolithiasis and a right ureter stone. His right ureter stone was 5 mm in size. It was symptomatic with right flank pain. This was improved with narcotics. He presents today for intervention for his stone. He states he passed a stone from the right side which may have been his right ureter stone. He also has right nephrolithiasis. This is a chronic problem. He had ureteroscopy on left kidney stones in July 2014. He returns today with a large amount of stones in the right side. The stones include locations of the right upper pole a 4 mm, 4 mm, 5 mm; right middle pole 3 mm, 4 mm, 5 mm; right lower pole 10 mm. We discussed management options and he presents today for cystoscopy, right ureteroscopy, laser lithotripsy, right retrograde pyelogram, possible right ureter stent placement. We discussed the risk and benefits, alternatives, and likelihood of achieving goals. UA 03/09/13 was negative for signs of infection.  PMH: Past Medical History  Diagnosis Date  . Aortic aneurysm, abdominal   . History of nephrolithiasis 06/10/11    "have 2 right now"  . 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 Arthritis     "back and some in my hands"  . Chronic lower back pain   . Chronic knee pain     right; "it never stops hurting"  . Kidney stones     hx multiple kidney stones  . Stroke 2004    acute idiopathic transverse myelitis  . Complication of anesthesia     in 2011 told to be anesthetized thru nose due to teeth being broke out  . Complication of anesthesia     top teeth are glued in. pt prefers aneth. thru mouth but be very careful of teeth  . Complication of anesthesia     DO NOT BEND RIGHT KNEE PAST 90 DEGREES.    PSH: Past Surgical History  Procedure Laterality Date  . Spine  surgery  04/2011    Lateral Internal sphinecterotomy  . Knee surgery  2007; 2008    unsuccessful right knee reconstruction; unsuccessful right knee reconstruction, 2nd time  . Pilonidal cyst / sinus excision  1976  . Laminectomy  06/10/11    "I've had the last 4 lumbar discs removed at different dates "  . Incision and drainage perirectal abscess  ~ 1995  . Anal sphincterotomy  05/05/11  . Endovascular stent insertion  06/18/2011    Procedure: ENDOVASCULAR STENT GRAFT INSERTION;  Surgeon: Pryor Ochoa, MD;  Location: [facility] OR;  Service: Vascular;  Laterality: N/A;  . Back surgery  1980, 1981    lumbar lam  . Right knee reconstruction  [id number],[id number]  . Peridontal osseous surgery    . Aneurysm, aorto-bifemoral bypass    . Cystoscopy w/ ureteral stent placement Left 11/10/2012    Procedure: CYSTOSCOPY WITH RETROGRADE PYELOGRAM/URETERAL STENT PLACEMENT;  Surgeon: Milford Cage, MD;  Location: [facility] ORS;  Service: Urology;  Laterality: Left;  . Cystoscopy with retrograde pyelogram, ureteroscopy and stent placement Left 12/06/2012    Procedure: CYSTOSCOPY WITH LEFT RETROGRADE PYELOGRAM, LEFT URETEROSCOPY AND LEFT STENT EXCHANGE;  Surgeon: Milford Cage, MD;  Location: [facility] OR[facility];  Service: Urology;  Laterality: Left;  . Holmium laser application Left 12/06/2012    Procedure: HOLMIUM LASER APPLICATION;  Surgeon: Reuel Boom  Gwenette Greet, MD;  Location: [facility] ORS;  Service: Urology;  Laterality: Left;    Allergies: No Known Allergies  Medications: Prescriptions prior to admission  Medication Sig Dispense Refill  . alprazolam (XANAX) 2 MG tablet Take 2 mg by mouth daily.      Marland Kitchen aspirin EC 81 MG tablet Take 81 mg by mouth every other day.      . cholecalciferol (VITAMIN D) 1000 UNITS tablet Take 2,000 Units by mouth daily.       Marland Kitchen HYDROmorphone (DILAUDID) 2 MG tablet Take 2 mg by mouth every 4 (four) hours as needed for pain.      . Omega-3 Fatty Acids (FISH OIL) 1200 MG CAPS Take 1,200 mg by mouth  2 (two) times daily.       Marland Kitchen oxyCODONE-acetaminophen (PERCOCET) 7.5-325 MG per tablet Take 1 tablet by mouth every 6 (six) hours as needed for pain.      Marland Kitchen oxybutynin (DITROPAN) 5 MG tablet Take 1 tablet (5 mg total) by mouth every 6 (six) hours as needed (bladder spasm).  40 tablet  4     Social History: History   Social History  . Marital Status: Married    Spouse Name: N/A    Number of Children: N/A  . Years of Education: N/A   Occupational History  . Not on file.   Social History Main Topics  . Smoking status: Former Smoker -- 0.50 packs/day for 35 years    Types: Cigarettes  . Smokeless tobacco: Never Used     Comment: quit in 08/23/12 but has started smoking again 5 daily  . Alcohol Use: No  . Drug Use: No     Comment: "smoked marijuana in college"  . Sexual Activity: Yes   Other Topics Concern  . Not on file   Social History Narrative  . No narrative on file    Family History: Family History  Problem Relation Age of Onset  . Hyperlipidemia Mother   . Heart disease Father   . Hyperlipidemia Father   . Stroke Father   . Peripheral vascular disease Father   . Heart disease Brother   . Hyperlipidemia Brother   . Anesthesia problems Neg Hx   . Hypotension Neg Hx   . Malignant hyperthermia Neg Hx   . Pseudochol deficiency Neg Hx     Review of Systems: Positive: Right flank pain. Negative: Fever, SOB, or chest pain.  A further 10 point review of systems was negative except what is listed in the HPI.  Physical Exam: Filed Vitals:   03/27/13 0655  BP: 132/77  Pulse: 69  Temp: 97.8 F (36.6 C)  Resp: 20    General: No acute distress.  Awake. Head:  Normocephalic.  Atraumatic. ENT:  EOMI.  Mucous membranes moist Neck:  Supple.  No lymphadenopathy. CV:  S1 present. S2 present. Regular rate. Pulmonary: Equal effort bilaterally.  Clear to auscultation bilaterally. Abdomen: Soft.  Non- tender to palpation. Skin:  Normal turgor.  No visible  rash. Extremity: No gross deformity of bilateral upper extremities.  No gross deformity of    bilateral lower extremities. Neurologic: Alert. Appropriate mood.    Studies:  No results found for this basename: HGB, WBC, PLT,  in the last 72 hours  No results found for this basename: NA, K, CL, CO2, BUN, CREATININE, CALCIUM, MAGNESIUM, GFRNONAA, GFRAA,  in the last 72 hours   No results found for this basename: PT, INR, APTT,  in the last 72 hours  No components found with this basename: ABG,     Assessment:  Right nephrolithiasis. Right ureter stone.  Plan: To OR for cystoscopy, right ureteroscopy, laser lithotripsy, right retrograde pyelogram, possible right ureter stent placement.

## 2013-03-27 NOTE — Anesthesia Preprocedure Evaluation (Signed)
Anesthesia Evaluation  Patient identified by MRN, date of birth, ID band Patient awake    Reviewed: Allergy & Precautions, H&P , NPO status , Patient's Chart, lab work & pertinent test results  History of Anesthesia Complications Negative for: history of anesthetic complications  Airway Mallampati: II TM Distance: >3 FB Neck ROM: full    Dental  (+) Dental Advisory Given and Caps Upper teeth glued in:   Pulmonary neg pulmonary ROS, Current Smoker,  breath sounds clear to auscultation  Pulmonary exam normal       Cardiovascular + Peripheral Vascular Disease Rhythm:regular Rate:Normal  AAA repair stent   Neuro/Psych 2004 History transverse myelitis with neurological injury CVA negative psych ROS   GI/Hepatic negative GI ROS, Neg liver ROS,   Endo/Other  negative endocrine ROS  Renal/GU Renal disease     Musculoskeletal   Abdominal   Peds  Hematology negative hematology ROS (+)   Anesthesia Other Findings Upper front teeth capped/bridge. H/o previous dental injury during GA at different institution  Reproductive/Obstetrics negative OB ROS                           Anesthesia Physical  Anesthesia Plan  ASA: III  Anesthesia Plan: General   Post-op Pain Management:    Induction: Intravenous  Airway Management Planned: LMA and Oral ETT  Additional Equipment:   Intra-op Plan:   Post-operative Plan: Extubation in OR  Informed Consent: I have reviewed the patients History and Physical, chart, labs and discussed the procedure including the risks, benefits and alternatives for the proposed anesthesia with the patient or authorized representative who has indicated his/her understanding and acceptance.   Dental Advisory Given and Dental advisory given  Plan Discussed with: CRNA  Anesthesia Plan Comments: (Risk of dental injury discussed)        Anesthesia Quick Evaluation

## 2013-03-28 ENCOUNTER — Encounter (HOSPITAL_COMMUNITY): Payer: Self-pay | Admitting: Urology

## 2013-10-30 ENCOUNTER — Other Ambulatory Visit: Payer: Medicare Other

## 2013-10-30 ENCOUNTER — Ambulatory Visit: Payer: Medicare Other | Admitting: Vascular Surgery

## 2013-11-06 ENCOUNTER — Ambulatory Visit: Payer: Medicare Other | Admitting: Vascular Surgery

## 2018-08-29 ENCOUNTER — Other Ambulatory Visit: Payer: Self-pay

## 2018-08-29 ENCOUNTER — Encounter (HOSPITAL_COMMUNITY): Payer: Self-pay

## 2018-08-29 ENCOUNTER — Emergency Department (HOSPITAL_COMMUNITY): Payer: Medicare Other

## 2018-08-29 ENCOUNTER — Emergency Department (HOSPITAL_COMMUNITY)
Admission: EM | Admit: 2018-08-29 | Discharge: 2018-08-29 | Disposition: A | Discharge status: Home / Self Care | Payer: Medicare Other | Attending: Emergency Medicine | Admitting: Emergency Medicine

## 2018-08-29 DIAGNOSIS — Z8679 Personal history of other diseases of the circulatory system: Secondary | ICD-10-CM | POA: Diagnosis not present

## 2018-08-29 DIAGNOSIS — R200 Anesthesia of skin: Secondary | ICD-10-CM | POA: Insufficient documentation

## 2018-08-29 DIAGNOSIS — M5441 Lumbago with sciatica, right side: Secondary | ICD-10-CM | POA: Diagnosis not present

## 2018-08-29 DIAGNOSIS — Z7982 Long term (current) use of aspirin: Secondary | ICD-10-CM | POA: Insufficient documentation

## 2018-08-29 DIAGNOSIS — F17210 Nicotine dependence, cigarettes, uncomplicated: Secondary | ICD-10-CM | POA: Diagnosis not present

## 2018-08-29 DIAGNOSIS — R109 Unspecified abdominal pain: Secondary | ICD-10-CM | POA: Diagnosis not present

## 2018-08-29 DIAGNOSIS — Z79899 Other long term (current) drug therapy: Secondary | ICD-10-CM | POA: Insufficient documentation

## 2018-08-29 DIAGNOSIS — Z87442 Personal history of urinary calculi: Secondary | ICD-10-CM | POA: Insufficient documentation

## 2018-08-29 DIAGNOSIS — R202 Paresthesia of skin: Secondary | ICD-10-CM | POA: Diagnosis not present

## 2018-08-29 DIAGNOSIS — M545 Low back pain: Secondary | ICD-10-CM | POA: Diagnosis present

## 2018-08-29 LAB — COMPREHENSIVE METABOLIC PANEL
ALT: 20 U/L (ref 0–44)
AST: 15 U/L (ref 15–41)
Albumin: 4.1 g/dL (ref 3.5–5.0)
Alkaline Phosphatase: 81 U/L (ref 38–126)
Anion gap: 8 (ref 5–15)
BUN: 17 mg/dL (ref 8–23)
CO2: 22 mmol/L (ref 22–32)
Calcium: 9 mg/dL (ref 8.9–10.3)
Chloride: 109 mmol/L (ref 98–111)
Creatinine, Ser: 1.02 mg/dL (ref 0.61–1.24)
GFR calc Af Amer: >60 mL/min (ref >60)
GFR calc non Af Amer: >60 mL/min (ref >60)
Glucose, Bld: 114 mg/dL — ABNORMAL HIGH (ref 70–99)
Potassium: 4.2 mmol/L (ref 3.5–5.1)
Sodium: 139 mmol/L (ref 135–145)
Total Bilirubin: 0.6 mg/dL (ref 0.3–1.2)
Total Protein: 7.2 g/dL (ref 6.5–8.1)

## 2018-08-29 LAB — CBC WITH DIFFERENTIAL/PLATELET
Abs Immature Granulocytes: 0.03 10*3/uL (ref 0.00–0.07)
Basophils Absolute: 0.1 10*3/uL (ref 0.0–0.1)
Basophils Relative: 1 %
Eosinophils Absolute: 0.1 10*3/uL (ref 0.0–0.5)
Eosinophils Relative: 2 %
HCT: 49.8 % (ref 39.0–52.0)
Hemoglobin: 16.1 g/dL (ref 13.0–17.0)
Immature Granulocytes: 1 %
Lymphocytes Relative: 25 %
Lymphs Abs: 1.5 10*3/uL (ref 0.7–4.0)
MCH: 29.9 pg (ref 26.0–34.0)
MCHC: 32.3 g/dL (ref 30.0–36.0)
MCV: 92.6 fL (ref 80.0–100.0)
Monocytes Absolute: 0.4 10*3/uL (ref 0.1–1.0)
Monocytes Relative: 7 %
Neutro Abs: 3.8 10*3/uL (ref 1.7–7.7)
Neutrophils Relative %: 64 %
Platelets: 245 10*3/uL (ref 150–400)
RBC: 5.38 MIL/uL (ref 4.22–5.81)
RDW: 13.1 % (ref 11.5–15.5)
WBC: 6 10*3/uL (ref 4.0–10.5)
nRBC: 0 % (ref 0.0–0.2)

## 2018-08-29 MED ORDER — HYDROMORPHONE HCL 1 MG/ML IJ SOLN
0.5000 mg | Freq: Once | INTRAMUSCULAR | Status: AC
  Administered 2018-08-29: 17:00:00 0.5 mg via INTRAVENOUS
  Filled 2018-08-29: qty 1

## 2018-08-29 MED ORDER — IOHEXOL 350 MG/ML SOLN
100.0000 mL | Freq: Once | INTRAVENOUS | Status: AC | PRN
  Administered 2018-08-29: 15:00:00 100 mL via INTRAVENOUS

## 2018-08-29 MED ORDER — HYDROMORPHONE HCL 1 MG/ML IJ SOLN
0.5000 mg | Freq: Once | INTRAMUSCULAR | Status: AC
  Administered 2018-08-29: 13:00:00 0.5 mg via INTRAVENOUS
  Filled 2018-08-29: qty 1

## 2018-08-29 MED ORDER — HYDROMORPHONE HCL 1 MG/ML IJ SOLN
0.5000 mg | Freq: Once | INTRAMUSCULAR | Status: AC
  Administered 2018-08-29: 14:00:00 0.5 mg via INTRAVENOUS
  Filled 2018-08-29: qty 1

## 2018-08-29 NOTE — ED Notes (Signed)
Pt verbalized understanding of all d/c instructions and f/u information. Opportunity for questioning and answers provided. VSS. All belongings with pt at this time. Pt ambulatory to lobby at this time, this RN emphasized to patient the risk of driving after administered narcotics, pt verbalized understanding and states family is on the way to pick him up.

## 2018-08-29 NOTE — ED Notes (Signed)
ED Provider at bedside. 

## 2018-08-29 NOTE — ED Triage Notes (Signed)
Pt reports hx of multiple back problems including surgery and hx of abd aneurysm that has been managed surgically.  Pt reports worsening pain and new abd pain for a few weeks.

## 2018-08-29 NOTE — Discharge Instructions (Signed)
Please read attached information. If you experience any new or worsening signs or symptoms please return to the emergency room for evaluation. Please follow-up with your primary care provider or specialist as discussed. Please use medication prescribed only as directed and discontinue taking if you have any concerning signs or symptoms.   °

## 2018-08-29 NOTE — ED Provider Notes (Signed)
[facility] EMERGENCY DEPARTMENT Provider Note   CSN: [id number] Arrival date & time: 08/29/18  1204    History   Chief Complaint Chief Complaint  Patient presents with  . Back Pain    HPI Joshua Bailey is a 63 y.o. male.     HPI    63 year old male with a significant past medical history of aortic aneurysm status post stenting in 2013, chronic back pain, arthritis, kidney stones presents today with complaints of abdominal and back pain.  Patient notes his back pain started approximately 2 months ago with a pressure pain in his lower back radiating down his right leg.  He notes this is typical of his previous back pain noting he does have sciatica.  He has had multiple back surgeries.  Patient notes approximately 2 weeks ago he developed minor abdominal discomfort described as pressure and bloating.  He notes normal bowel movements.  He notes intermittent tingling and numbness in his lower legs with no dermatomal pattern.  He denies any significant weakness at this time.  He denies any chest pain or shortness of breath.  He notes oxycodone at home for chronic pain that has not been improving his symptoms.   Past Medical History:  Diagnosis Date  . Aortic aneurysm, abdominal (HCC)   . Arthritis    "back and some in my hands"  . Chronic knee pain    right; "it never stops hurting"  . Chronic lower back pain   . Complication of anesthesia    in 2011 told to be anesthetized thru nose due to teeth being broke out  . Complication of anesthesia    top teeth are glued in. pt prefers aneth. thru mouth but be very careful of teeth  . Complication of anesthesia    DO NOT BEND RIGHT KNEE PAST 90 DEGREES.  . History of nephrolithiasis 06/10/11   "have 2 right now"  . Kidney stones    hx multiple kidney stones  . 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 Transverse myelitis (HCC) 2004   "dx'd w/acute idopathic transverse myelitis; resulted in  drop foot on left; numbed left side of body"    Patient Active Problem List   Diagnosis Date Noted  . AAA (abdominal aortic aneurysm) (HCC) 04/25/2012  . Syncope 06/10/2011  . Thrombocytopenia (HCC) 06/10/2011  . Abdominal aneurysm without mention of rupture 06/01/2011    Past Surgical History:  Procedure Laterality Date  . ANAL SPHINCTEROTOMY  05/05/11  . aneurysm, aorto-bifemoral bypass    . BACK SURGERY  1980, 1981   lumbar lam  . CYSTOSCOPY W/ URETERAL STENT PLACEMENT Left 11/10/2012   Procedure: CYSTOSCOPY WITH RETROGRADE PYELOGRAM/URETERAL STENT PLACEMENT;  Surgeon: Milford Cage, MD;  Location: [facility];  Service: Urology;  Laterality: Left;  . CYSTOSCOPY WITH RETROGRADE PYELOGRAM, URETEROSCOPY AND STENT PLACEMENT Left 12/06/2012   Procedure: CYSTOSCOPY WITH LEFT RETROGRADE PYELOGRAM, LEFT URETEROSCOPY AND LEFT STENT EXCHANGE;  Surgeon: Milford Cage, MD;  Location: [facility];  Service: Urology;  Laterality: Left;  . CYSTOSCOPY WITH RETROGRADE PYELOGRAM, URETEROSCOPY AND STENT PLACEMENT Right 03/27/2013   Procedure: CYSTOSCOPY , URETEROSCOPY AND STENT PLACEMENT;  Surgeon: Milford Cage, MD;  Location: [facility];  Service: Urology;  Laterality: Right;  . ENDOVASCULAR STENT INSERTION  06/18/2011   Procedure: ENDOVASCULAR STENT GRAFT INSERTION;  Surgeon: Pryor Ochoa, MD;  Location: [facility] OR;  Service: Vascular;  Laterality: N/A;  . HOLMIUM LASER APPLICATION Left 12/06/2012  Procedure: HOLMIUM LASER APPLICATION;  Surgeon: Milford Cage, MD;  Location: [facility];  Service: Urology;  Laterality: Left;  . HOLMIUM LASER APPLICATION Right 03/27/2013   Procedure: HOLMIUM LASER APPLICATION;  Surgeon: Milford Cage, MD;  Location: [facility];  Service: Urology;  Laterality: Right;  . INCISION AND DRAINAGE PERIRECTAL ABSCESS  ~ 1995  . KNEE SURGERY  2007; 2008   unsuccessful right knee reconstruction; unsuccessful right knee reconstruction, 2nd time  . LAMINECTOMY   06/10/11   "I've had the last 4 lumbar discs removed at different dates "  . peridontal osseous surgery    . PILONIDAL CYST / SINUS EXCISION  1976  . right knee reconstruction  [id number]  . SPINE SURGERY  04/2011   Lateral Internal sphinecterotomy        Home Medications    Prior to Admission medications   Medication Sig Start Date End Date Taking? Authorizing Provider  alprazolam Prudy Feeler) 2 MG tablet Take 2 mg by mouth daily.    [provider]  aspirin EC 81 MG tablet Take 81 mg by mouth every other day.    [provider]  cephALEXin (KEFLEX) 500 MG capsule Take 1 capsule (500 mg total) by mouth 3 (three) times daily. Begin the day before your remove your stent. 03/27/13   Natalia Leatherwood, MD  cholecalciferol (VITAMIN D) 1000 UNITS tablet Take 2,000 Units by mouth daily.     [provider]  Omega-3 Fatty Acids (FISH OIL) 1200 MG CAPS Take 1,200 mg by mouth 2 (two) times daily.     [provider]  oxybutynin (DITROPAN) 5 MG tablet Take 1 tablet (5 mg total) by mouth every 6 (six) hours as needed (bladder spasm). 12/06/12   Natalia Leatherwood, MD  oxyCODONE-acetaminophen (PERCOCET) 5-325 MG per tablet Take 1-2 tablets by mouth every 4 (four) hours as needed for moderate pain. 03/27/13   Natalia Leatherwood, MD  phenazopyridine (PYRIDIUM) 200 MG tablet Take 1 tablet (200 mg total) by mouth 3 (three) times daily as needed for pain. 03/27/13   Natalia Leatherwood, MD  senna-docusate (SENOKOT S) 8.6-50 MG per tablet Take 1 tablet by mouth 2 (two) times daily. 03/27/13   Natalia Leatherwood, MD    Family History Family History  Problem Relation Age of Onset  . Hyperlipidemia Mother   . Heart disease Father   . Hyperlipidemia Father   . Stroke Father   . Peripheral vascular disease Father   . Heart disease Brother   . Hyperlipidemia Brother   . Anesthesia problems Neg Hx   . Hypotension Neg Hx   . Malignant hyperthermia Neg Hx   . Pseudochol deficiency Neg Hx      Social History Social History   Tobacco Use  . Smoking status: Former Smoker    Packs/day: 0.50    Years: 35.00    Pack years: 17.50    Types: Cigarettes  . Smokeless tobacco: Never Used  . Tobacco comment: quit in 08/23/12 but has started smoking again 5 daily  Substance Use Topics  . Alcohol use: No  . Drug use: No    Types: Marijuana    Comment: "smoked marijuana in college"     Allergies   Patient has no known allergies.   Review of Systems Review of Systems  All other systems reviewed and are negative.  Physical Exam Updated Vital Signs BP (!) 145/101   Pulse (!) 54   Temp 98.2 F (36.8 C) (Oral)   Resp 18  Ht 6' (1.829 m)   Wt 95.3 kg   SpO2 99%   BMI 28.48 kg/m   Physical Exam Vitals signs and nursing note reviewed.  Constitutional:      Appearance: He is well-developed.  HENT:     Head: Normocephalic and atraumatic.  Eyes:     General: No scleral icterus.       Right eye: No discharge.        Left eye: No discharge.     Conjunctiva/sclera: Conjunctivae normal.     Pupils: Pupils are equal, round, and reactive to light.  Neck:     Musculoskeletal: Normal range of motion.     Vascular: No JVD.     Trachea: No tracheal deviation.  Cardiovascular:     Rate and Rhythm: Normal rate and regular rhythm.     Comments: Bilateral pedal pulses intact, distal perfusion intact cap refill less than 3 seconds-bilateral radial pulses equal bilateral 2+ Pulmonary:     Effort: Pulmonary effort is normal.     Breath sounds: No stridor.  Abdominal:     General: There is no distension.     Palpations: Abdomen is soft.     Tenderness: There is no abdominal tenderness.     Comments: No pulsatile masses noted  Musculoskeletal:     Comments: No midline tenderness to the back, no soft tissue swelling or edema, distal sensation strength and motor function intact, straight leg positive right  Neurological:     Mental Status: He is alert and oriented to person,  place, and time.     Coordination: Coordination normal.  Psychiatric:        Behavior: Behavior normal.        Thought Content: Thought content normal.        Judgment: Judgment normal.    ED Treatments / Results  Labs (all labs ordered are listed, but only abnormal results are displayed) Labs Reviewed  COMPREHENSIVE METABOLIC PANEL - Abnormal; Notable for the following components:      Result Value   Glucose, Bld 114 (*)    All other components within normal limits  CBC WITH DIFFERENTIAL/PLATELET    EKG EKG Interpretation  Date/Time:  Tuesday August 29 2018 12:54:53 EDT Ventricular Rate:  58 PR Interval:  132 QRS Duration: 84 QT Interval:  396 QTC Calculation: 388 R Axis:   75 Text Interpretation:  Sinus bradycardia Nonspecific ST abnormality Abnormal ECG Repeat EKG requested Within limitations, no significant cahnge though limited by baseline wander Confirmed by Shaune Pollack [phone]) on 08/29/2018 2:42:19 PM   Radiology Ct Angio Abd/pel W And/or Wo Contrast  Result Date: 08/29/2018 CLINICAL DATA:  Back pain for 6 months EXAM: CTA ABDOMEN AND PELVIS wITHOUT AND WITH CONTRAST TECHNIQUE: Multidetector CT imaging of the abdomen and pelvis was performed using the standard protocol during bolus administration of intravenous contrast. Multiplanar reconstructed images and MIPs were obtained and reviewed to evaluate the vascular anatomy. CONTRAST:  OMNIPAQUE IOHEXOL 350 MG/ML SOLN COMPARISON:  11/10/2012 FINDINGS: VASCULAR Aorta: Aorto bi-iliac stent graft is in place. Aortic and iliac lumens are patent. There is no evidence of endoleak. The aneurysm sac is markedly shrunk now with a maximal AP diameter of 3.4 cm compared with 4.9 cm on the prior study. Embolic material and coils within the aneurysm sac are noted. Celiac: Patent. SMA: Patent. Renals: Single renal arteries are patent. IMA: Origin is occluded.  Branches reconstitute. Inflow: Right common iliac landing zone is patent  with a maximal diameter  of 2.0 cm compared with 1.9 cm on the prior study. Internal and external iliac arteries are tortuous and somewhat ectatic but are patent. Left common iliac artery landing zone is patent. Maximal diameter of the left common iliac artery is 2.5 cm compare with 1.9 cm based on my direct measurements. Left internal and external iliac arteries are somewhat tortuous and ectatic but are patent. Proximal Outflow: Grossly patent. Veins: No evidence of DVT.  Retroaortic left renal vein anatomy. Review of the MIP images confirms the above findings. NON-VASCULAR Lower chest: Dependent atelectasis in the lungs. Hepatobiliary: Liver and gallbladder are unremarkable. Pancreas: Unremarkable Spleen: Unremarkable Adrenals/Urinary Tract: Adrenal glands are within normal limits. Bilateral nephrolithiasis is not significantly changed. The large previously visualized left ureteral calculus has resolved. There is focal hyperdense material within the mid left ureter on image 97 of series 5. This may represent a small grouping of calculi within the mid left ureter. There is no evidence of hydronephrosis or perinephric stranding. Bladder is within normal limits. Stomach/Bowel: Normal appendix. No obvious mass in the colon. Sigmoid diverticulosis without evidence of acute diverticulitis. Small bowel and stomach are decompressed. Lymphatic: No abnormal retroperitoneal adenopathy. Reproductive: Prostate is prominent. Other: No free fluid. Musculoskeletal: No vertebral compression deformity. Advanced degenerative change in the lumbar spine is noted. Postoperative changes at L5-S1 are present. IMPRESSION: VASCULAR Aorto bi-iliac stent graft is in place. Maximal AP diameter of the aneurysm sac has reduced from 4.9 cm to 3.4 cm. No evidence of endoleak. 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 Left common iliac artery aneurysm measures 2.5 cm compared with 1.9 cm on the  prior study. NON-VASCULAR Bilateral nephrolithiasis. Small nonobstructing mid left ureteral calculi are suspected. There is no hydronephrosis. Degenerative changes and postoperative changes in the lumbar spine. Sigmoid diverticulosis without definitive evidence of acute diverticulitis. Electronically Signed   By: Jolaine ClickArthur  Hoss M.D.   On: 08/29/2018 15:32    Procedures Procedures (including critical care time)  Medications Ordered in ED Medications  HYDROmorphone (DILAUDID) injection 0.5 mg (has no administration in time range)  HYDROmorphone (DILAUDID) injection 0.5 mg (0.5 mg Intravenous Given 08/29/18 1236)  HYDROmorphone (DILAUDID) injection 0.5 mg (0.5 mg Intravenous Given 08/29/18 1408)  iohexol (OMNIPAQUE) 350 MG/ML injection 100 mL (100 mLs Intravenous Contrast Given 08/29/18 1434)     Initial Impression / Assessment and Plan / ED Course  I have reviewed the triage vital signs and the nursing notes.  Pertinent labs & imaging results that were available during my care of the patient were reviewed by me and considered in my medical decision making (see chart for details).         Assessment/Plan: 63 year old male presents today with complaints of back pain and abdominal pressure.  I do have high suspicion for chronic back pain with worsening of symptoms.  He has no acute neurological deficits or red flags for back pain at this time.  Patient will be treated with pain medication for his back pain.  Patient is also having abdominal pressure over the last several weeks.  He has no infectious symptoms at this time but given his significant past medical history of abdominal aortic aneurysm he will have a CT angios abdomen and pelvis for evaluation of the aneurysm.  He has no signs of dissection presently as he is in no severe acute distress, he has no chest pain or shortness of breath at this time.  CT is reassuring.  He  has a very minor increase in aneurysm in the left iliac, no other  significant changes.  Low suspicion for abdominal pathology at this time.  Patient has some improvement in symptoms with pain medicine here.  He has no acute neurological deficits that require further evaluation and management.  He will contact his cardiothoracic surgeon inform him of today's CT for his review.  He will consult neurosurgery for ongoing back pain.  Strict return precautions given.  He verbalized understanding and agreement to today's plan.   Final Clinical Impressions(s) / ED Diagnoses   Final diagnoses:  Acute right-sided low back pain with right-sided sciatica  Abdominal pain, unspecified abdominal location    ED Discharge Orders    None       [place], Poehlman 08/29/18 1611    Shaune Pollack, MD 08/30/18 [phone]

## 2018-09-11 ENCOUNTER — Emergency Department (HOSPITAL_COMMUNITY): Payer: Medicare Other

## 2018-09-11 ENCOUNTER — Other Ambulatory Visit: Payer: Self-pay

## 2018-09-11 ENCOUNTER — Emergency Department (HOSPITAL_COMMUNITY)
Admission: EM | Admit: 2018-09-11 | Discharge: 2018-09-11 | Disposition: A | Discharge status: Home / Self Care | Payer: Medicare Other | Attending: Emergency Medicine | Admitting: Emergency Medicine

## 2018-09-11 ENCOUNTER — Encounter (HOSPITAL_COMMUNITY): Payer: Self-pay | Admitting: Pharmacy Technician

## 2018-09-11 DIAGNOSIS — Z79899 Other long term (current) drug therapy: Secondary | ICD-10-CM | POA: Diagnosis not present

## 2018-09-11 DIAGNOSIS — G8929 Other chronic pain: Secondary | ICD-10-CM | POA: Diagnosis not present

## 2018-09-11 DIAGNOSIS — Z87891 Personal history of nicotine dependence: Secondary | ICD-10-CM | POA: Diagnosis not present

## 2018-09-11 DIAGNOSIS — Z7982 Long term (current) use of aspirin: Secondary | ICD-10-CM | POA: Insufficient documentation

## 2018-09-11 DIAGNOSIS — M5441 Lumbago with sciatica, right side: Secondary | ICD-10-CM | POA: Insufficient documentation

## 2018-09-11 DIAGNOSIS — M5126 Other intervertebral disc displacement, lumbar region: Secondary | ICD-10-CM | POA: Diagnosis not present

## 2018-09-11 LAB — CBC WITH DIFFERENTIAL/PLATELET
Abs Immature Granulocytes: 0.01 10*3/uL (ref 0.00–0.07)
Basophils Absolute: 0.1 10*3/uL (ref 0.0–0.1)
Basophils Relative: 1 %
Eosinophils Absolute: 0.1 10*3/uL (ref 0.0–0.5)
Eosinophils Relative: 2 %
HCT: 51.5 % (ref 39.0–52.0)
Hemoglobin: 17.2 g/dL — ABNORMAL HIGH (ref 13.0–17.0)
Immature Granulocytes: 0 %
Lymphocytes Relative: 37 %
Lymphs Abs: 2.3 10*3/uL (ref 0.7–4.0)
MCH: 31.4 pg (ref 26.0–34.0)
MCHC: 33.4 g/dL (ref 30.0–36.0)
MCV: 94 fL (ref 80.0–100.0)
Monocytes Absolute: 0.5 10*3/uL (ref 0.1–1.0)
Monocytes Relative: 8 %
Neutro Abs: 3.2 10*3/uL (ref 1.7–7.7)
Neutrophils Relative %: 52 %
Platelets: 211 10*3/uL (ref 150–400)
RBC: 5.48 MIL/uL (ref 4.22–5.81)
RDW: 13.2 % (ref 11.5–15.5)
WBC: 6.1 10*3/uL (ref 4.0–10.5)
nRBC: 0 % (ref 0.0–0.2)

## 2018-09-11 LAB — BASIC METABOLIC PANEL
Anion gap: 15 (ref 5–15)
BUN: 15 mg/dL (ref 8–23)
CO2: 24 mmol/L (ref 22–32)
Calcium: 9.4 mg/dL (ref 8.9–10.3)
Chloride: 101 mmol/L (ref 98–111)
Creatinine, Ser: 1.01 mg/dL (ref 0.61–1.24)
GFR calc Af Amer: >60 mL/min (ref >60)
GFR calc non Af Amer: >60 mL/min (ref >60)
Glucose, Bld: 76 mg/dL (ref 70–99)
Potassium: 3.8 mmol/L (ref 3.5–5.1)
Sodium: 140 mmol/L (ref 135–145)

## 2018-09-11 MED ORDER — METHYLPREDNISOLONE 4 MG PO TBPK: ORAL_TABLET | ORAL | 0 refills | Status: AC

## 2018-09-11 MED ORDER — METHOCARBAMOL 500 MG PO TABS: 500.0000 mg | ORAL_TABLET | Freq: Two times a day (BID) | ORAL | 0 refills | Status: AC

## 2018-09-11 MED ORDER — LIDOCAINE 5 % EX PTCH
1.0000 | MEDICATED_PATCH | CUTANEOUS | Status: DC
  Administered 2018-09-11: 20:00:00 1 via TRANSDERMAL
  Filled 2018-09-11: qty 1

## 2018-09-11 MED ORDER — DEXAMETHASONE SODIUM PHOSPHATE 10 MG/ML IJ SOLN
10.0000 mg | Freq: Once | INTRAMUSCULAR | Status: AC
  Administered 2018-09-11: 20:00:00 10 mg via INTRAVENOUS
  Filled 2018-09-11: qty 1

## 2018-09-11 MED ORDER — HYDROMORPHONE HCL 1 MG/ML IJ SOLN
1.0000 mg | Freq: Once | INTRAMUSCULAR | Status: AC
  Administered 2018-09-11: 15:00:00 1 mg via INTRAVENOUS
  Filled 2018-09-11: qty 1

## 2018-09-11 MED ORDER — MORPHINE SULFATE (PF) 4 MG/ML IV SOLN
8.0000 mg | Freq: Once | INTRAVENOUS | Status: AC
Start: 2018-09-11 — End: 2018-09-11
  Administered 2018-09-11: 8 mg via INTRAVENOUS
  Filled 2018-09-11: qty 2

## 2018-09-11 MED ORDER — LORAZEPAM 2 MG/ML IJ SOLN
1.0000 mg | Freq: Once | INTRAMUSCULAR | Status: AC
  Administered 2018-09-11: 1 mg via INTRAVENOUS
  Filled 2018-09-11: qty 1

## 2018-09-11 MED ORDER — MORPHINE SULFATE (PF) 4 MG/ML IV SOLN
8.0000 mg | Freq: Once | INTRAVENOUS | Status: AC
  Administered 2018-09-11: 20:00:00 8 mg via INTRAVENOUS
  Filled 2018-09-11: qty 2

## 2018-09-11 NOTE — ED Notes (Signed)
Patient back from MRI.

## 2018-09-11 NOTE — ED Notes (Signed)
Patient Alert and oriented to baseline. Stable and ambulatory to baseline. Patient verbalized understanding of the discharge instructions.  Patient belongings were taken by the patient.   

## 2018-09-11 NOTE — Discharge Instructions (Signed)
Please call Dr. Marcy Siren office tomorrow to set up a follow up appointment Start Medrol dose pack - take as prescribed Take muscle relaxer as needed for muscle pain/spasms You can get lidocaine 4% patches over the counter Return if you are worsening

## 2018-09-11 NOTE — ED Provider Notes (Signed)
63 year old male with worsening back pain with R side sciatica. Pending MRI at shift change. Follows Dr. Maurice Small with neurosurgery. He takes Percocet 10/325mg  for chronic pain. He was given IV Dilaudid and Ativan and pain is ~8/10. Will give additional pain control  MRI shows L5 right neural impingement. Will discuss with neurosurgery again.  Discussed with Meyran, NP. She recommends either admitting to hospitalist for pain control and they will follow in the morning vs following up as outpatient. After pain control he rates his pain as 4/10. What he really wants is surgery on his back. He was advised that this would not likely happen during admission. After this was discussed he decided that he would like to go home. We will give him rx for Medrol dose pack and muscle relaxer. He was encouraged to call Dr. Marcy Siren office tomorrow to set up a f/u appt.   Bethel Born, PA-C 09/11/18 2342    Sabas Sous, MD 09/12/18 [phone]

## 2018-09-11 NOTE — ED Notes (Signed)
Patient transported to MRI 

## 2018-09-11 NOTE — ED Provider Notes (Signed)
[name] EMERGENCY DEPARTMENT Provider Note   CSN: [id number] Arrival date & time: 09/11/18  1333    History   Chief Complaint Chief Complaint  Patient presents with  . Back Pain    HPI Joshua Bailey is a 63 y.o. male.     HPI   Joshua Bailey is a 62 y.o. male, with a history of AAA, chronic back pain, transverse myelitis, presenting to the ED with back pain for the last 4 weeks.  Pain is severe, sharp, midline lower back, nonradiating. Also endorses right lower extremity weakness and numbness. He has been under the care of Dr. Maurice Small, [place] [facility].  States he has been scheduled for an MRI tomorrow, but the pain has become too great.  He called the neurosurgery office this morning and was told to come to the ED.  Denies fever/chills, falls/trauma, abdominal pain, chest pain, shortness of breath, changes in bowel or bladder function, saddle anesthesias, or any other complaints.    Past Medical History:  Diagnosis Date  . Aortic aneurysm, abdominal (HCC)   . Arthritis    "back and some in my hands"  . Chronic knee pain    right; "it never stops hurting"  . Chronic lower back pain   . Complication of anesthesia    in 2011 told to be anesthetized thru nose due to teeth being broke out  . Complication of anesthesia    top teeth are glued in. pt prefers aneth. thru mouth but be very careful of teeth  . Complication of anesthesia    DO NOT BEND RIGHT KNEE PAST 90 DEGREES.  . History of nephrolithiasis 06/10/11   "have 2 right now"  . Kidney stones    hx multiple kidney stones  . Stroke [facility]) 2004   acute idiopathic transverse myelitis  . Transverse myelitis (HCC) 2004   "dx'd w/acute idopathic transverse myelitis; resulted in drop foot on left; numbed left side of body"    Patient Active Problem List   Diagnosis Date Noted  . AAA (abdominal aortic aneurysm) (HCC) 04/25/2012  . Syncope 06/10/2011  . Thrombocytopenia (HCC)  06/10/2011  . Abdominal aneurysm without mention of rupture 06/01/2011    Past Surgical History:  Procedure Laterality Date  . ANAL SPHINCTEROTOMY  05/05/11  . aneurysm, aorto-bifemoral bypass    . BACK SURGERY  1980, 1981   lumbar lam  . CYSTOSCOPY W/ URETERAL STENT PLACEMENT Left 11/10/2012   Procedure: CYSTOSCOPY WITH RETROGRADE PYELOGRAM/URETERAL STENT PLACEMENT;  Surgeon: Milford Cage, MD;  Location: [facility];  Service: Urology;  Laterality: Left;  . CYSTOSCOPY WITH RETROGRADE PYELOGRAM, URETEROSCOPY AND STENT PLACEMENT Left 12/06/2012   Procedure: CYSTOSCOPY WITH LEFT RETROGRADE PYELOGRAM, LEFT URETEROSCOPY AND LEFT STENT EXCHANGE;  Surgeon: Milford Cage, MD;  Location: [facility];  Service: Urology;  Laterality: Left;  . CYSTOSCOPY WITH RETROGRADE PYELOGRAM, URETEROSCOPY AND STENT PLACEMENT Right 03/27/2013   Procedure: CYSTOSCOPY , URETEROSCOPY AND STENT PLACEMENT;  Surgeon: Milford Cage, MD;  Location: [facility];  Service: Urology;  Laterality: Right;  . ENDOVASCULAR STENT INSERTION  06/18/2011   Procedure: ENDOVASCULAR STENT GRAFT INSERTION;  Surgeon: Pryor Ochoa, MD;  Location: [facility] OR;  Service: Vascular;  Laterality: N/A;  . HOLMIUM LASER APPLICATION Left 12/06/2012   Procedure: HOLMIUM LASER APPLICATION;  Surgeon: Milford Cage, MD;  Location: [facility];  Service: Urology;  Laterality: Left;  . HOLMIUM LASER APPLICATION Right 03/27/2013   Procedure: HOLMIUM LASER APPLICATION;  Surgeon: Bettye Boeck  Margarita Grizzle, MD;  Location: [facility];  Service: Urology;  Laterality: Right;  . INCISION AND DRAINAGE PERIRECTAL ABSCESS  ~ 1995  . KNEE SURGERY  2007; 2008   unsuccessful right knee reconstruction; unsuccessful right knee reconstruction, 2nd time  . LAMINECTOMY  06/10/11   "I've had the last 4 lumbar discs removed at different dates "  . peridontal osseous surgery    . PILONIDAL CYST / SINUS EXCISION  1976  . right knee reconstruction  [id number]  . SPINE SURGERY   04/2011   Lateral Internal sphinecterotomy        Home Medications    Prior to Admission medications   Medication Sig Start Date End Date Taking? Authorizing Provider  alprazolam Prudy Feeler) 2 MG tablet Take 2 mg by mouth daily.    [provider]  aspirin EC 81 MG tablet Take 81 mg by mouth every other day.    [provider]  cephALEXin (KEFLEX) 500 MG capsule Take 1 capsule (500 mg total) by mouth 3 (three) times daily. Begin the day before your remove your stent. 03/27/13   Natalia Leatherwood, MD  cholecalciferol (VITAMIN D) 1000 UNITS tablet Take 2,000 Units by mouth daily.     [provider]  Omega-3 Fatty Acids (FISH OIL) 1200 MG CAPS Take 1,200 mg by mouth 2 (two) times daily.     [provider]  oxybutynin (DITROPAN) 5 MG tablet Take 1 tablet (5 mg total) by mouth every 6 (six) hours as needed (bladder spasm). 12/06/12   Natalia Leatherwood, MD  oxyCODONE-acetaminophen (PERCOCET) 5-325 MG per tablet Take 1-2 tablets by mouth every 4 (four) hours as needed for moderate pain. 03/27/13   Natalia Leatherwood, MD  phenazopyridine (PYRIDIUM) 200 MG tablet Take 1 tablet (200 mg total) by mouth 3 (three) times daily as needed for pain. 03/27/13   Natalia Leatherwood, MD  senna-docusate (SENOKOT S) 8.6-50 MG per tablet Take 1 tablet by mouth 2 (two) times daily. 03/27/13   Natalia Leatherwood, MD    Family History Family History  Problem Relation Age of Onset  . Hyperlipidemia Mother   . Heart disease Father   . Hyperlipidemia Father   . Stroke Father   . Peripheral vascular disease Father   . Heart disease Brother   . Hyperlipidemia Brother   . Anesthesia problems Neg Hx   . Hypotension Neg Hx   . Malignant hyperthermia Neg Hx   . Pseudochol deficiency Neg Hx     Social History Social History   Tobacco Use  . Smoking status: Former Smoker    Packs/day: 0.50    Years: 35.00    Pack years: 17.50    Types: Cigarettes  . Smokeless tobacco: Never Used  .  Tobacco comment: quit in 08/23/12 but has started smoking again 5 daily  Substance Use Topics  . Alcohol use: No  . Drug use: No    Types: Marijuana    Comment: "smoked marijuana in college"     Allergies   Patient has no known allergies.   Review of Systems Review of Systems  Constitutional: Negative for chills, diaphoresis and fever.  Respiratory: Negative for shortness of breath.   Cardiovascular: Negative for chest pain and leg swelling.  Gastrointestinal: Negative for abdominal pain, diarrhea, nausea and vomiting.  Genitourinary: Negative for decreased urine volume and difficulty urinating.  Musculoskeletal: Positive for back pain.  Neurological: Positive for weakness and numbness.  All other systems reviewed and are negative.    Physical Exam Updated  Vital Signs BP (!) 164/102   Pulse (!) 57   Temp 98.1 F (36.7 C) (Oral)   Resp 15   Ht 6' (1.829 m)   Wt 95 kg   SpO2 94%   BMI 28.40 kg/m   Physical Exam Vitals signs and nursing note reviewed.  Constitutional:      General: He is not in acute distress.    Appearance: He is well-developed. He is not diaphoretic.  HENT:     Head: Normocephalic and atraumatic.     Mouth/Throat:     Mouth: Mucous membranes are moist.     Pharynx: Oropharynx is clear.  Eyes:     Conjunctiva/sclera: Conjunctivae normal.  Neck:     Musculoskeletal: Neck supple.  Cardiovascular:     Rate and Rhythm: Normal rate and regular rhythm.     Pulses: Normal pulses.          Dorsalis pedis pulses are 2+ on the right side and 2+ on the left side.       Posterior tibial pulses are 2+ on the right side and 2+ on the left side.     Heart sounds: Normal heart sounds.     Comments: Tactile temperature in the extremities appropriate and equal bilaterally. Pulmonary:     Effort: Pulmonary effort is normal. No respiratory distress.     Breath sounds: Normal breath sounds.  Abdominal:     Palpations: Abdomen is soft.     Tenderness: There is  no abdominal tenderness. There is no guarding.  Musculoskeletal:     Right lower leg: No edema.     Left lower leg: No edema.     Comments: Some tenderness to the midline lumbar spine without noted deformity, swelling, or instability.  Lymphadenopathy:     Cervical: No cervical adenopathy.  Skin:    General: Skin is warm and dry.  Neurological:     Mental Status: He is alert.     Deep Tendon Reflexes:     Reflex Scores:      Patellar reflexes are 1+ on the right side and 2+ on the left side.    Comments: Endorses decreased sensation to light touch in the right lower extremity. Strength 4/5 at the right hip, knee, and ankle. Strength 5/5 on the left. Antalgic gait.  Psychiatric:        Mood and Affect: Mood and affect normal.        Speech: Speech normal.        Behavior: Behavior normal.      ED Treatments / Results  Labs (all labs ordered are listed, but only abnormal results are displayed) Labs Reviewed - No data to display  EKG None  Radiology No results found.  Procedures Procedures (including critical care time)  Medications Ordered in ED Medications  LORazepam (ATIVAN) injection 1 mg (has no administration in time range)  HYDROmorphone (DILAUDID) injection 1 mg (1 mg Intravenous Given 09/11/18 1432)     Initial Impression / Assessment and Plan / ED Course  I have reviewed the triage vital signs and the nursing notes.  Pertinent labs & imaging results that were available during my care of the patient were reviewed by me and considered in my medical decision making (see chart for details).  Clinical Course as of Sep 10 1636  Mon Sep 11, 2018  1451 Spoke with Dr. Marcia Brash, patient's neurosurgeon.  We discussed patient's presentation and physical exam findings, to include decreased strength and patellar reflexes. States patient  will most likely need concurrent pain management to allow MRI to be performed.  Recommends MR lumbar spine without contrast.  If no  emergent finding, proceed with outpatient follow-up.   [SJ]  1552 Patient states his pain is still severe.   [SJ]    Clinical Course User Index [SJ] ,  C, PA-C       Patient presents with back pain for several weeks.  He does have some objective weakness on exam of the right lower extremity.  My suspicion for cauda equina syndrome is low at this time.  End of shift patient care handoff report given to Terance HartKelly Gekas, PA-C. Plan: MRI lumbar spine pending.  May need to reconsult Dr. Maurice Smallstergard based on MRI findings to establish a more detailed follow up plan.   Final Clinical Impressions(s) / ED Diagnoses   Final diagnoses:  None    ED Discharge Orders    None       Concepcion Living,  C, PA-C 09/11/18 1644    Rolan BuccoBelfi, Melanie, MD 09/13/18 1406

## 2018-09-11 NOTE — ED Triage Notes (Signed)
Pt with back pain onset several weeks ago. Hx several back surgeries and states he feels as if a disc in his back has ruptured.

## 2021-02-05 ENCOUNTER — Other Ambulatory Visit: Payer: Self-pay | Admitting: Urology

## 2021-02-12 ENCOUNTER — Telehealth: Payer: Self-pay

## 2021-02-12 NOTE — Telephone Encounter (Signed)
Patient calls today to ask if we received the results of his CT scan. They did not appear in Epic, but I was able to locate a recent CTA on [facility] PACS that was ordered by urology. He was last seen by Dr. Hart Rochester in 2014. He had an EVAR in 2013 and developed an endoleak which was subsequently treated by Dr. Fredia Sorrow. The CTA shows a slight increase in aneurysm size. Scheduled phone visit with Dr. Chestine Spore to discuss results.

## 2021-02-16 ENCOUNTER — Ambulatory Visit (HOSPITAL_BASED_OUTPATIENT_CLINIC_OR_DEPARTMENT_OTHER): Admit: 2021-02-16 | Payer: Medicare Other | Admitting: Urology

## 2021-02-16 ENCOUNTER — Encounter (HOSPITAL_BASED_OUTPATIENT_CLINIC_OR_DEPARTMENT_OTHER): Payer: Self-pay

## 2021-02-16 SURGERY — CYSTOSCOPY/URETEROSCOPY/HOLMIUM LASER/STENT PLACEMENT
Anesthesia: General | Laterality: Right

## 2021-03-10 ENCOUNTER — Encounter: Payer: Self-pay | Admitting: Vascular Surgery

## 2021-03-10 ENCOUNTER — Ambulatory Visit: Payer: Medicare Other | Admitting: Vascular Surgery

## 2021-03-10 ENCOUNTER — Ambulatory Visit (INDEPENDENT_AMBULATORY_CARE_PROVIDER_SITE_OTHER): Payer: Medicare Other | Admitting: Vascular Surgery

## 2021-03-10 DIAGNOSIS — I723 Aneurysm of iliac artery: Secondary | ICD-10-CM

## 2021-03-10 NOTE — Progress Notes (Signed)
Virtual Visit via Telephone Note    I connected with Joshua Bailey on 03/10/2021 using the [employer] by telephone and verified that I was speaking with the correct person using two identifiers. Patient was located at home. I am located at [facility] office.   The limitations of evaluation and management by telemedicine and the availability of in person appointments have been previously discussed with the patient and are documented in the patients chart. The patient expressed understanding and consented to proceed.  PCP: Forrest Moron, MD  Chief Complaint: Enlarging left common iliac artery aneurysm  History of Present Illness: Joshua Bailey is a 65 y.o. male who presents for a phone visit to discuss enlarging left common iliac artery aneurysm.  He previously had an EVAR in 2013 for AAA with Dr. Myra Gianotti and Dr. Hart Rochester.  He recently had a CT stone study that showed increase in his left common iliac aneurysm from 2.5 cm in 2020 to 3.1 cm.  He denies any abdominal or back pain.  He just had about teeth pulled and extensive dental work.  Past Medical History:  Diagnosis Date   Aortic aneurysm, abdominal    Arthritis    "back and some in my hands"   Chronic knee pain    right; "it never stops hurting"   Chronic lower back pain    Complication of anesthesia    in 2011 told to be anesthetized thru nose due to teeth being broke out   Complication of anesthesia    top teeth are glued in. pt prefers aneth. thru mouth but be very careful of teeth   Complication of anesthesia    DO NOT BEND RIGHT KNEE PAST 90 DEGREES.   History of nephrolithiasis 06/10/11   "have 2 right now"   Kidney stones    hx multiple kidney stones   Stroke [facility]) 2004   acute idiopathic transverse myelitis   Transverse myelitis (HCC) 2004   "dx'd w/acute idopathic transverse myelitis; resulted in drop foot on left; numbed left side of body"    Past Surgical History:  Procedure Laterality Date   ANAL  SPHINCTEROTOMY  05/05/11   aneurysm, aorto-bifemoral bypass     BACK SURGERY  1980, 1981   lumbar lam   CYSTOSCOPY W/ URETERAL STENT PLACEMENT Left 11/10/2012   Procedure: CYSTOSCOPY WITH RETROGRADE PYELOGRAM/URETERAL STENT PLACEMENT;  Surgeon: Milford Cage, MD;  [facility];  Service: Urology;  Laterality: Left;   CYSTOSCOPY WITH RETROGRADE PYELOGRAM, URETEROSCOPY AND STENT PLACEMENT Left 12/06/2012   Procedure: CYSTOSCOPY WITH LEFT RETROGRADE PYELOGRAM, LEFT URETEROSCOPY AND LEFT STENT EXCHANGE;  Surgeon: Milford Cage, MD;  Location: [facility];  Service: Urology;  Laterality: Left;   CYSTOSCOPY WITH RETROGRADE PYELOGRAM, URETEROSCOPY AND STENT PLACEMENT Right 03/27/2013   Procedure: CYSTOSCOPY , URETEROSCOPY AND STENT PLACEMENT;  Surgeon: Milford Cage, MD;  Location: [facility];  Service: Urology;  Laterality: Right;   ENDOVASCULAR STENT INSERTION  06/18/2011   Procedure: ENDOVASCULAR STENT GRAFT INSERTION;  Surgeon: Pryor Ochoa, MD;  Location: [facility] OR;  Service: Vascular;  Laterality: N/A;   HOLMIUM LASER APPLICATION Left 12/06/2012   Procedure: HOLMIUM LASER APPLICATION;  Surgeon: Milford Cage, MD;  [facility]: [facility];  Service: Urology;  Laterality: Left;   HOLMIUM LASER APPLICATION Right 03/27/2013   Procedure: HOLMIUM LASER APPLICATION;  Surgeon: Milford Cage, MD;  [facility]: [facility];  Service: Urology;  Laterality: Right;   INCISION AND DRAINAGE PERIRECTAL ABSCESS  ~ 1995  KNEE SURGERY  2007; 2008   unsuccessful right knee reconstruction; unsuccessful right knee reconstruction, 2nd time   LAMINECTOMY  06/10/11   "I've had the last 4 lumbar discs removed at different dates "   peridontal osseous surgery     PILONIDAL CYST / SINUS EXCISION  1976   right knee reconstruction  2007,2008   SPINE SURGERY  04/2011   Lateral Internal sphinecterotomy    Current Meds  Medication Sig   alprazolam (XANAX) 2 MG tablet Take 2 mg by mouth daily.    cholecalciferol (VITAMIN D) 1000 UNITS tablet Take 2,000 Units by mouth daily.    Omega-3 Fatty Acids (FISH OIL) 1200 MG CAPS Take 1,200 mg by mouth 2 (two) times daily.    oxyCODONE-acetaminophen (PERCOCET) 10-325 MG tablet Take 1 tablet by mouth 4 (four) times daily.    12 system ROS was negative unless otherwise noted in HPI   Observations/Objective:  Left common iliac artery aneurysm 3.1 cm     Assessment and Plan:  65 year old male status post EVAR in 2013 for AAA.  He now has evidence of an enlarging left common iliac artery aneurysm.  This is at the distal landing zone of the left limb of his endograft.  This is pictured above.  I discussed typically thinking about repair in the 3-3.5 cm range to prevent rupture.  I would like to get a CTA abdomen pelvis and then options would likely be coiling the left hypogastric and extending the limb of the endograft into external iliac artery.  He wants to wait several months to see me given he just had a bunch of dental work and teeth pulled.  We will schedule CTA and follow-up with me at his availability to further discuss.  Follow Up Instructions:   Follow up CTA and see me in the office   I discussed the assessment and treatment plan with the patient. The patient was provided an opportunity to ask questions and all were answered. The patient agreed with the plan and demonstrated an understanding of the instructions.   The patient was advised to call back or seek an in-person evaluation if the symptoms worsen or if the condition fails to improve as anticipated.  I spent 5 minutes with the patient via telephone encounter.   Signed, Cephus Shelling [facility] Office: [phone]  03/10/2021, 4:43 PM

## 2021-03-14 IMAGING — MR MRI LUMBAR SPINE WITHOUT CONTRAST
4 of 6 series · 19 of 48 positions shown · non-contrast
Comparison: CT abdomen and pelvis 08/29/2018.

CLINICAL DATA: Low back and RIGHT leg pain. History of endovascular
repair of aortic aneurysm. History of transverse myelitis. History
of multiple back surgeries.

EXAM:
MRI LUMBAR SPINE WITHOUT CONTRAST
TECHNIQUE: Multiplanar, multisequence MR imaging of the lumbar spine was
performed. No intravenous contrast was administered.

[Series 3: T2 · sagittal · 4.0mm · 0.55mm/px · 5 of 13 slices shown (1 of 2)]
[im 1/13]
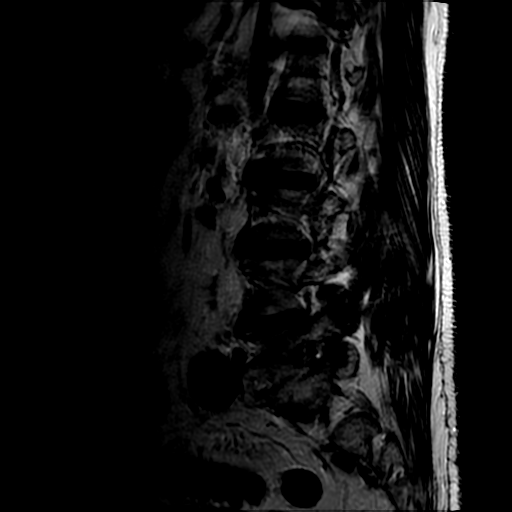
[im 4/13]
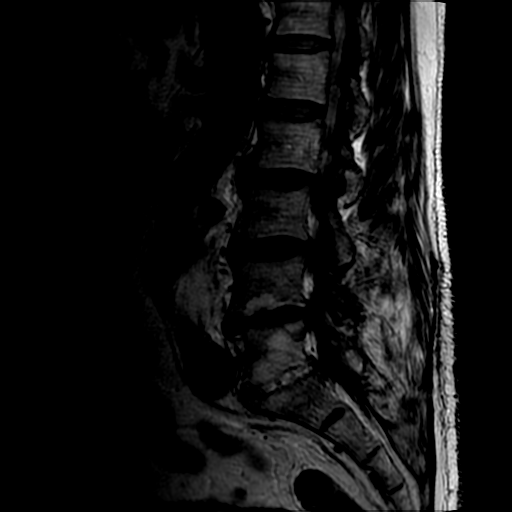
[im 7/13]
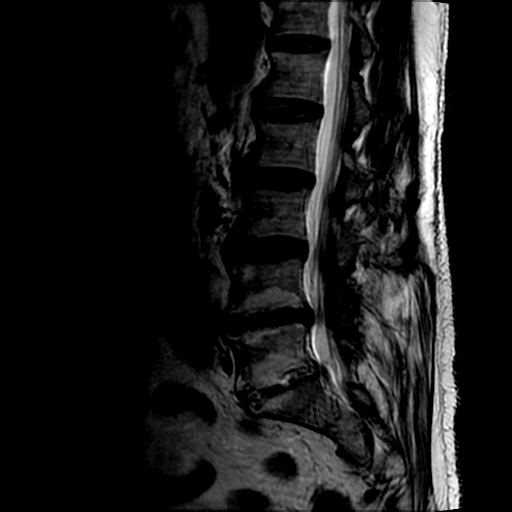
[im 10/13]
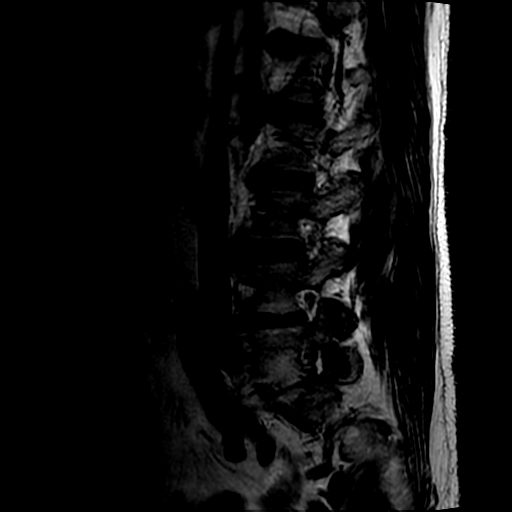
[im 13/13]
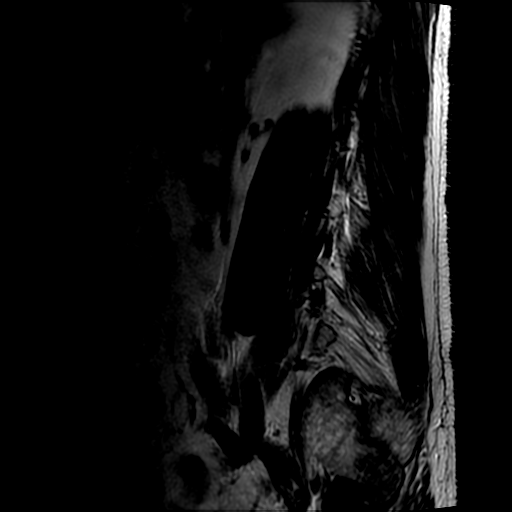

[Series 5: T1 · sagittal · 4.0mm · 0.55mm/px · 3 of 13 slices shown (1 of 2)]
[im 1/13]
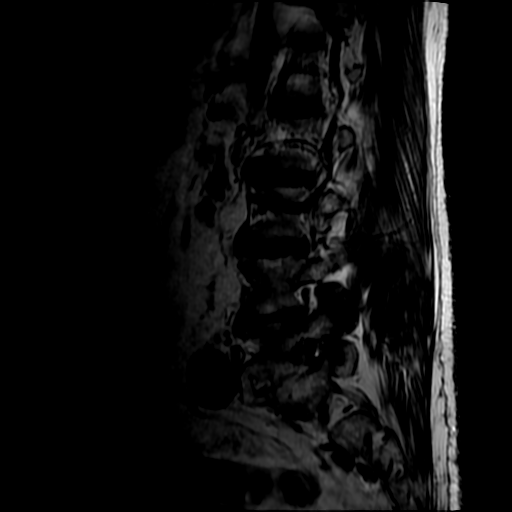
[im 7/13]
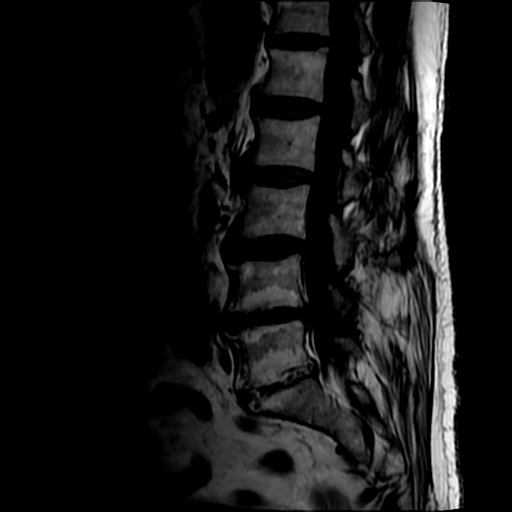
[im 13/13]
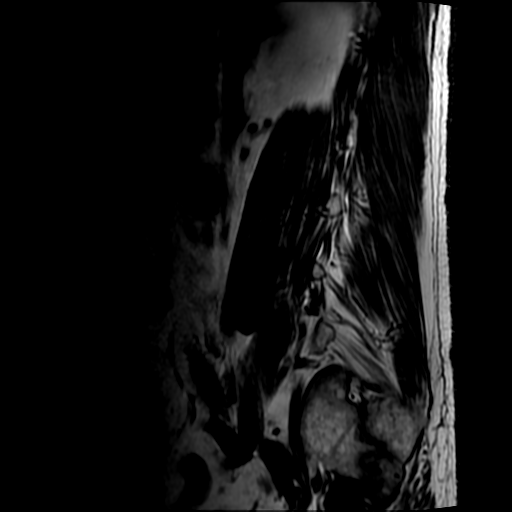

[Series 7: T2 · axial · 4.0mm · 0.39mm/px · z∈[-169,+26]mm · 8 of 40 slices shown (2 of 2)]
[im 1/40]
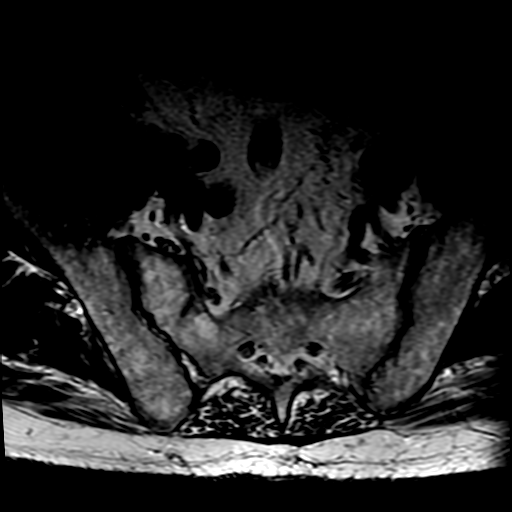
[im 7/40]
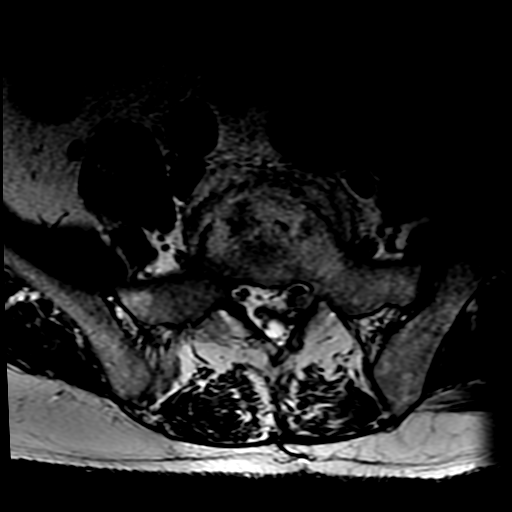
[im 13/40]
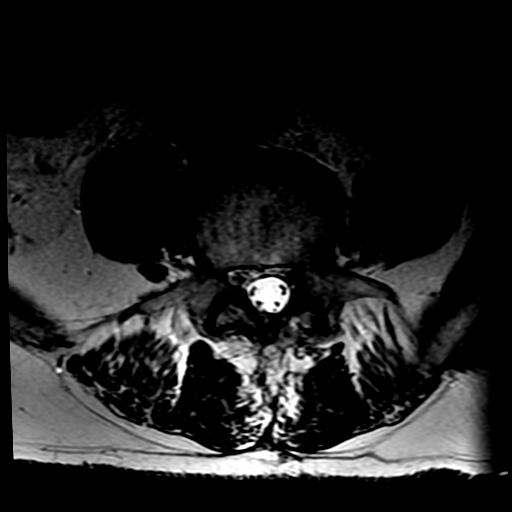
[im 19/40]
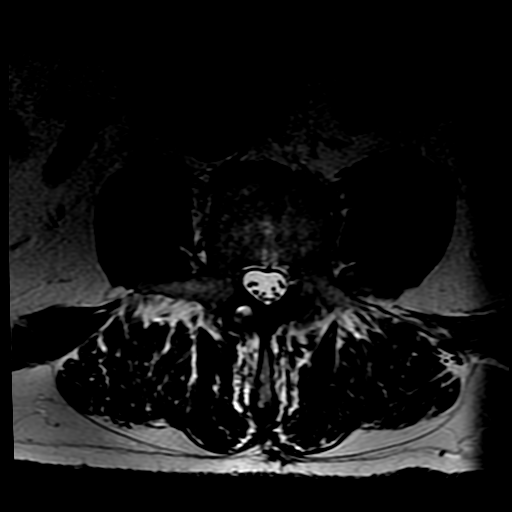
[im 22/40]
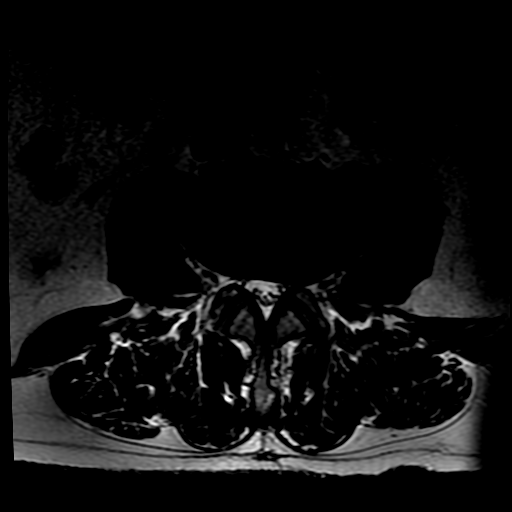
[im 28/40]
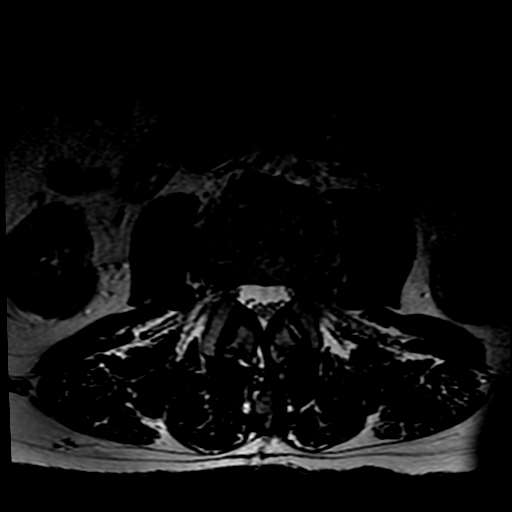
[im 34/40]
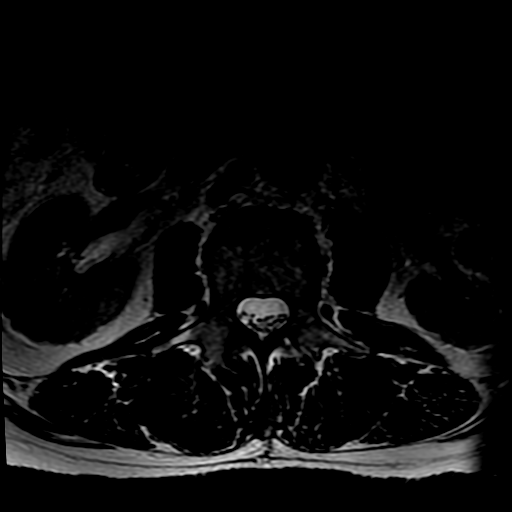
[im 40/40]
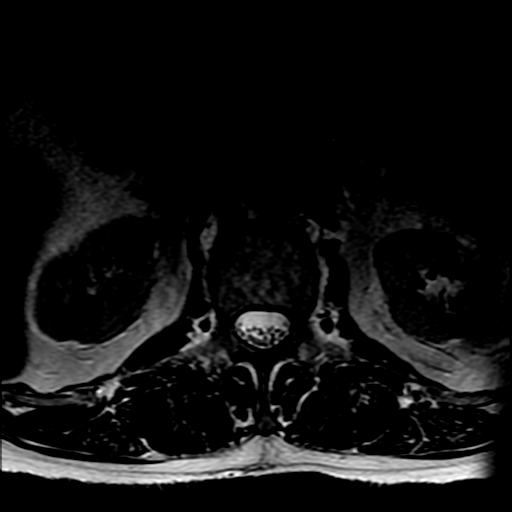

[Series 8: T1 · axial · 4.0mm · 0.39mm/px · z∈[-139,-4]mm · 3 of 40 slices shown (2 of 2)]
[im 7/40]
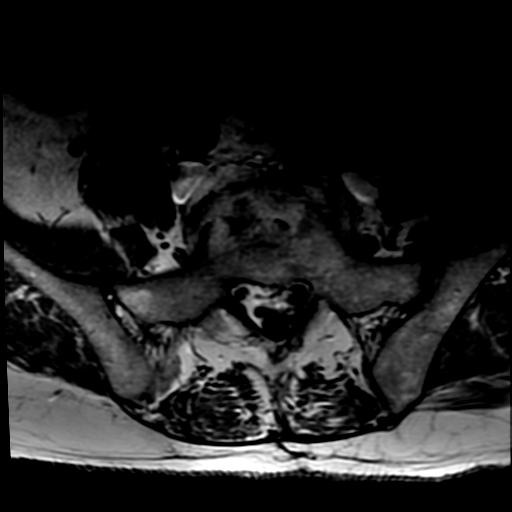
[im 22/40]
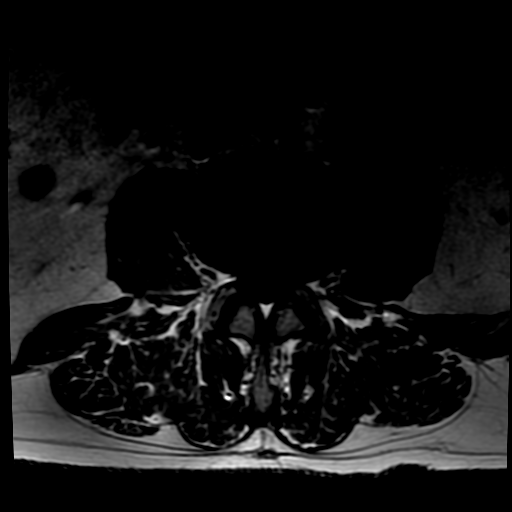
[im 34/40]
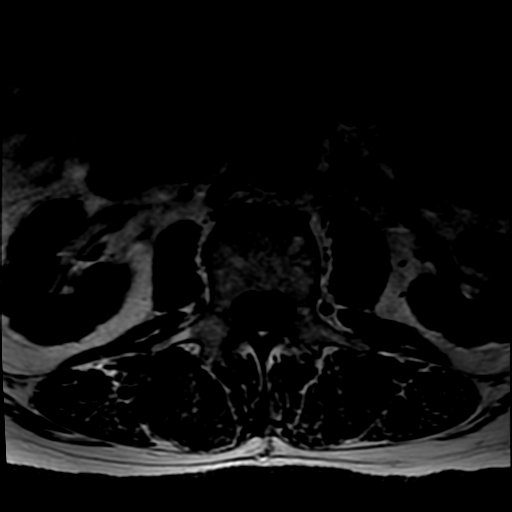

[19 of 48 positions shown; findings below may reference images not displayed]

FINDINGS: Segmentation:  Standard.

Alignment:  Anatomic.

Vertebrae: No fracture, evidence of diskitis, or worrisome bone
lesion

Conus medullaris and cauda equina: Conus extends to the L1 level.
Conus and cauda equina appear normal.

Paraspinal and other soft tissues: No acute findings. Retroaortic
LEFT renal vein.

Disc levels:

L1-L2:  Calcified annulus.  No frank protrusion.  No impingement.

L2-L3:  Disc desiccation with annular bulge.  No impingement.

L3-L4:  Disc desiccation with annular bulge.  No impingement.

L4-L5: Disc space narrowing. Central and rightward protrusion. Prior
laminectomy. Facet arthropathy. RIGHT L5 neural impingement.

L5-S1: Prior laminectomy. Spontaneous arthrodesis across a narrowed
disc space. Facet arthropathy osseous spurring contributes to
subarticular zone and foraminal zone narrowing, which could affect
the LEFT greater than RIGHT L5 and S1 nerve roots.
IMPRESSION: The dominant RIGHT-sided abnormality is at L4-5 where a central and
rightward protrusion is observed. Despite prior laminectomy,
subarticular zone narrowing affects the RIGHT L5 nerve root.

Spontaneous arthrodesis L5-S1. Prior laminectomy. Narrowed disc
space. Osseous spurring and facet arthropathy contributes to LEFT
greater than RIGHT L5 and S1 neural impingement.

Visualized distal thoracic cord, conus, and cauda carina nerve roots
appear unremarkable.

## 2021-03-31 ENCOUNTER — Ambulatory Visit: Payer: Medicare Other | Admitting: Vascular Surgery

## 2021-04-06 ENCOUNTER — Other Ambulatory Visit: Payer: Self-pay

## 2021-04-06 DIAGNOSIS — I723 Aneurysm of iliac artery: Secondary | ICD-10-CM

## 2021-05-01 ENCOUNTER — Ambulatory Visit
Admission: RE | Admit: 2021-05-01 | Discharge: 2021-05-01 | Disposition: A | Discharge status: Home / Self Care | Payer: Medicare Other | Source: Ambulatory Visit | Attending: Vascular Surgery | Admitting: Vascular Surgery

## 2021-05-01 DIAGNOSIS — I723 Aneurysm of iliac artery: Secondary | ICD-10-CM

## 2021-05-01 MED ORDER — IOPAMIDOL (ISOVUE-370) INJECTION 76%
75.0000 mL | Freq: Once | INTRAVENOUS | Status: AC | PRN
  Administered 2021-05-01: 75 mL via INTRAVENOUS

## 2021-05-05 ENCOUNTER — Encounter: Payer: Self-pay | Admitting: Vascular Surgery

## 2021-05-05 ENCOUNTER — Ambulatory Visit (INDEPENDENT_AMBULATORY_CARE_PROVIDER_SITE_OTHER): Payer: Medicare Other | Admitting: Vascular Surgery

## 2021-05-05 ENCOUNTER — Other Ambulatory Visit: Payer: Self-pay

## 2021-05-05 VITALS — BP 138/82 | HR 54 | Temp 98.2°F | Resp 16 | Ht 72.0 in | Wt 184.0 lb

## 2021-05-05 DIAGNOSIS — I723 Aneurysm of iliac artery: Secondary | ICD-10-CM | POA: Diagnosis not present

## 2021-05-05 NOTE — Progress Notes (Signed)
Patient name: Joshua Bailey MRN: [id number] DOB: 1955-12-13 Sex: male  REASON FOR VISIT: Follow-up after CTA to discuss enlarging left common iliac artery aneurysm  HPI: Joshua Bailey is a 65 y.o. male presents for follow-up to discuss enlarging left common iliac artery aneurysm.  He initially had an EVAR in 2013 by Dr. Myra Gianotti and Dr. Hart Rochester.  He had a stone study in 2020 that showed increase in the left common iliac artery aneurysm from 2.5 cm to 3.1 cm.  We sent him for CTA and he presents for follow-up.  He has chronic back pain but otherwise no new symptoms.  He lost about 20 pounds and had a bunch of dental work done recently with his teeth pulled.  Past Medical History:  Diagnosis Date   Aortic aneurysm, abdominal    Arthritis    "back and some in my hands"   Chronic knee pain    right; "it never stops hurting"   Chronic lower back pain    Complication of anesthesia    in 2011 told to be anesthetized thru nose due to teeth being broke out   Complication of anesthesia    top teeth are glued in. pt prefers aneth. thru mouth but be very careful of teeth   Complication of anesthesia    DO NOT BEND RIGHT KNEE PAST 90 DEGREES.   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   Transverse myelitis (HCC) 2004   "dx'd w/acute idopathic transverse myelitis; resulted in drop foot on left; numbed left side of body"    Past Surgical History:  Procedure Laterality Date   ANAL SPHINCTEROTOMY  05/05/11   aneurysm, aorto-bifemoral bypass     BACK SURGERY  1980, 1981   lumbar lam   CYSTOSCOPY W/ URETERAL STENT PLACEMENT Left 11/10/2012   Procedure: CYSTOSCOPY WITH RETROGRADE PYELOGRAM/URETERAL STENT PLACEMENT;  Surgeon: Milford Cage, MD;  Location: [facility];  Service: Urology;  Laterality: Left;   CYSTOSCOPY WITH RETROGRADE PYELOGRAM, URETEROSCOPY AND STENT  PLACEMENT Left 12/06/2012   Procedure: CYSTOSCOPY WITH LEFT RETROGRADE PYELOGRAM, LEFT URETEROSCOPY AND LEFT STENT EXCHANGE;  Surgeon: Milford Cage, MD;  Location: [facility];  Service: Urology;  Laterality: Left;   CYSTOSCOPY WITH RETROGRADE PYELOGRAM, URETEROSCOPY AND STENT PLACEMENT Right 03/27/2013   Procedure: CYSTOSCOPY , URETEROSCOPY AND STENT PLACEMENT;  Surgeon: Milford Cage, MD;  Location: [facility];  Service: Urology;  Laterality: Right;   ENDOVASCULAR STENT INSERTION  06/18/2011   Procedure: ENDOVASCULAR STENT GRAFT INSERTION;  Surgeon: Pryor Ochoa, MD;  Location: [facility];  Service: Vascular;  Laterality: N/A;   HOLMIUM LASER APPLICATION Left 12/06/2012   Procedure: HOLMIUM LASER APPLICATION;  Surgeon: Milford Cage, MD;  Location: [facility];  Service: Urology;  Laterality: Left;   HOLMIUM LASER APPLICATION Right 03/27/2013   Procedure: HOLMIUM LASER APPLICATION;  Surgeon: Milford Cage, MD;  Location: [facility];  Service: Urology;  Laterality: Right;   INCISION AND DRAINAGE PERIRECTAL ABSCESS  ~ 1995   KNEE SURGERY  2007; 2008   unsuccessful right knee reconstruction; unsuccessful right knee reconstruction, 2nd time   LAMINECTOMY  06/10/11   "I've had the last 4 lumbar discs removed at different dates "   peridontal osseous surgery     PILONIDAL CYST / SINUS EXCISION  1976   right knee reconstruction  4098,1191  SPINE SURGERY  04/2011   Lateral Internal sphinecterotomy    Family History  Problem Relation Age of Onset   Hyperlipidemia Mother    Heart disease Father    Hyperlipidemia Father    Stroke Father    Peripheral vascular disease Father    Heart disease Brother    Hyperlipidemia Brother    Anesthesia problems Neg Hx    Hypotension Neg Hx    Malignant hyperthermia Neg Hx    Pseudochol deficiency Neg Hx     SOCIAL HISTORY: Social History   Tobacco Use   Smoking status: Former    Packs/day: 0.50    Years: 35.00    Pack years: 17.50     Types: Cigarettes   Smokeless tobacco: Never   Tobacco comments:    quit in 08/23/12 but has started smoking again 5 daily  Substance Use Topics   Alcohol use: No    No Known Allergies  Current Outpatient Medications  Medication Sig Dispense Refill   alprazolam (XANAX) 2 MG tablet Take 2 mg by mouth daily.     Omega-3 Fatty Acids (FISH OIL) 1200 MG CAPS Take 1,200 mg by mouth 2 (two) times daily.      oxyCODONE-acetaminophen (PERCOCET) 10-325 MG tablet Take 1 tablet by mouth 4 (four) times daily.     aspirin EC 81 MG tablet Take 81 mg by mouth every other day. (Patient not taking: Reported on 03/10/2021)     cholecalciferol (VITAMIN D) 1000 UNITS tablet Take 2,000 Units by mouth daily.      methocarbamol (ROBAXIN) 500 MG tablet Take 1 tablet (500 mg total) by mouth 2 (two) times daily. (Patient not taking: Reported on 03/10/2021) 20 tablet 0   methylPREDNISolone (MEDROL DOSEPAK) 4 MG TBPK tablet Day 1:  8 mg PO before breakfast, 4 mg after lunch, 4 mg after dinner, and 8 mg at bedtime   Day 2:  4 mg PO before breakfast, 4 mg after lunch, 4 mg after dinner, and 8 mg at bedtime  Day 3:  4 mg PO before breakfast, 4 mg after lunch, 4 mg after dinner, and 4 mg at bedtime  Day 4:  4 mg PO before breakfast, 4 mg after lunch, and 4 mg at bedtime   Day 5:  4 mg PO before breakfast, and 4 mg at bedtime  Day 6:  4 mg PO before breakfast (Patient not taking: Reported on 03/10/2021) 21 tablet 0   No current facility-administered medications for this visit.    REVIEW OF SYSTEMS:  [X]  denotes positive finding, [ ]  denotes negative finding Cardiac  Comments:  Chest pain or chest pressure:    Shortness of breath upon exertion:    Short of breath when lying flat:    Irregular heart rhythm:        Vascular    Pain in calf, thigh, or hip brought on by ambulation:    Pain in feet at night that wakes you up from your sleep:     Blood clot in your veins:    Leg swelling:         Pulmonary    Oxygen  at home:    Productive cough:     Wheezing:         Neurologic    Sudden weakness in arms or legs:     Sudden numbness in arms or legs:     Sudden onset of difficulty speaking or slurred speech:    Temporary loss of vision in  one eye:     Problems with dizziness:         Gastrointestinal    Blood in stool:     Vomited blood:         Genitourinary    Burning when urinating:     Blood in urine:        Psychiatric    Major depression:         Hematologic    Bleeding problems:    Problems with blood clotting too easily:        Skin    Rashes or ulcers:        Constitutional    Fever or chills:      PHYSICAL EXAM: Vitals:   05/05/21 0822  BP: 138/82  Pulse: (!) 54  Resp: 16  Temp: 98.2 F (36.8 C)  TempSrc: Temporal  SpO2: 94%  Weight: 184 lb (83.5 kg)  Height: 6' (1.829 m)    GENERAL: The patient is a well-nourished male, in no acute distress. The vital signs are documented above. CARDIAC: There is a regular rate and rhythm.  VASCULAR:  Palpable femoral pulses bilaterally Palpable DP pulses bilaterally PULMONARY: No respiratory distress. ABDOMEN: Soft and non-tender. MUSCULOSKELETAL: There are no major deformities or cyanosis. NEUROLOGIC: No focal weakness or paresthesias are detected. SKIN: There are no ulcers or rashes noted. PSYCHIATRIC: The patient has a normal affect.  DATA:      Assessment/Plan:  65 year old male that underwent EVAR in 2013 for abdominal aortic aneurysm.  He is being seen for enlarging left common iliac artery aneurysm.  As pictured above, I measure this at approximately 3.3 to 3.4 cm.  I discussed that these are typically repaired at 3 to 3.5 cm to prevent rupture.  I discussed embolizing the left hypogastric and extending with an additional limb into the left external iliac artery to treat the expanding aneurysm.  He is very hesitant to proceed at this time given he is having dental work done and wants to get that completed into  the new year and has lost about 20 pounds and does not want additional surgery right now.  I will see him again in 6 months with ultrasound here in the office.   Cephus Shelling, MD [facility] Office: [phone]

## 2021-05-06 ENCOUNTER — Encounter: Payer: Self-pay | Admitting: Vascular Surgery

## 2021-05-06 ENCOUNTER — Other Ambulatory Visit: Payer: Self-pay

## 2021-05-06 DIAGNOSIS — I723 Aneurysm of iliac artery: Secondary | ICD-10-CM

## 2023-11-02 IMAGING — CT CT CTA ABD/PEL W/CM AND/OR W/O CM
2 of 10 series · 12 of 46 positions shown, 17 images · IV contrast (iopamidol)
Comparison: 08/29/2018

CLINICAL DATA: Iliac artery aneurysm post endovascular stent

EXAM:
CTA ABDOMEN AND PELVIS WITHOUT AND WITH CONTRAST
TECHNIQUE: Multidetector CT imaging of the abdomen and pelvis was performed
using the standard protocol during bolus administration of
intravenous contrast. Multiplanar reconstructed images and MIPs were
obtained and reviewed to evaluate the vascular anatomy.
CONTRAST:  75mL UVMOX0-XTC IOPAMIDOL (UVMOX0-XTC) INJECTION 76%

[Series 8: cta arterial 2.00 bv36 s3 cor art st · axial · arterial · 0.75mm/px · z∈[+1151,+1541]mm · 10 of 225 slices shown, 15 images (1 of 2)]
[im 15/225  soft-tissue]
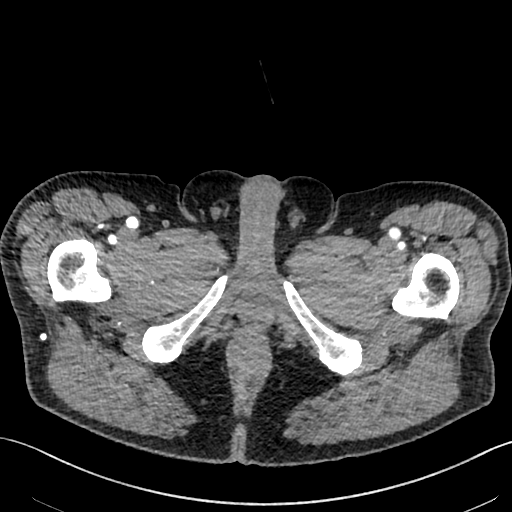
[im 15/225  bone]
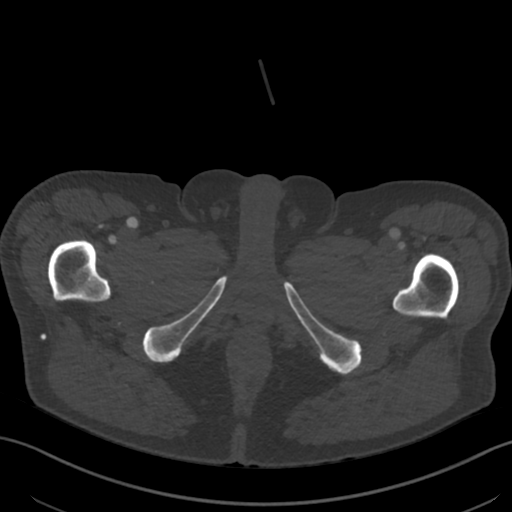
[im 45/225  soft-tissue]
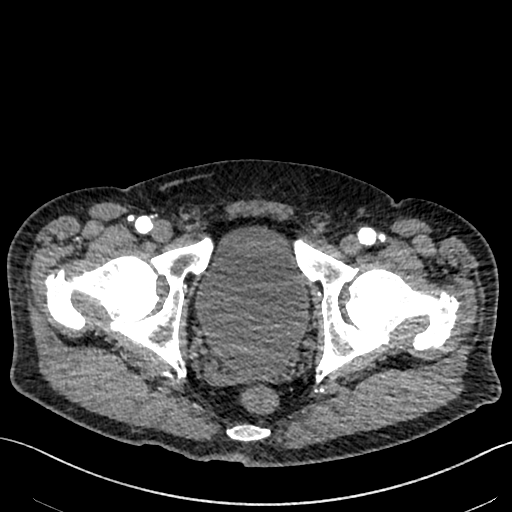
[im 60/225  soft-tissue]
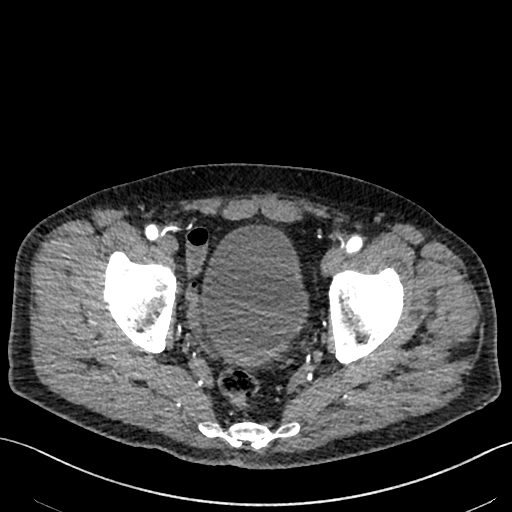
[im 90/225  soft-tissue]
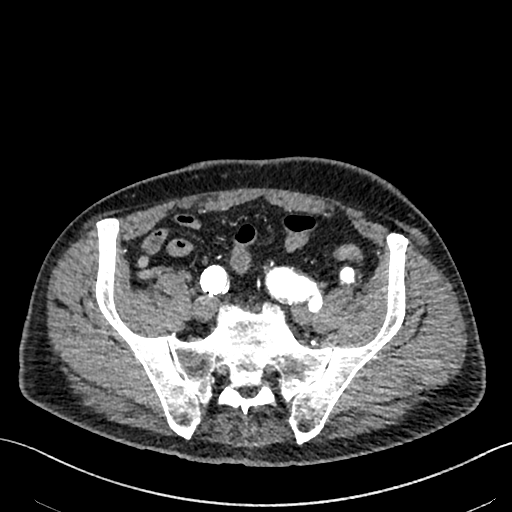
[im 120/225  soft-tissue]
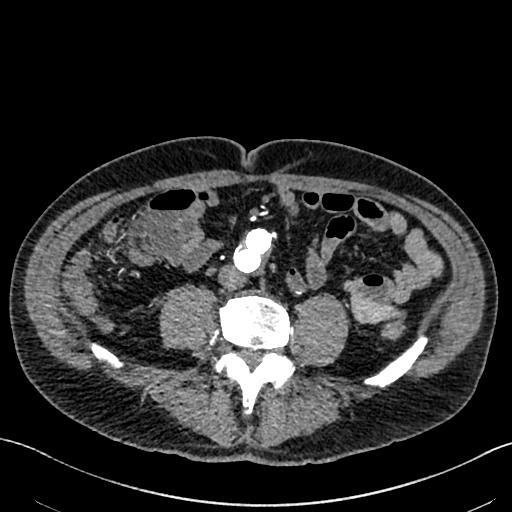
[im 135/225  soft-tissue]
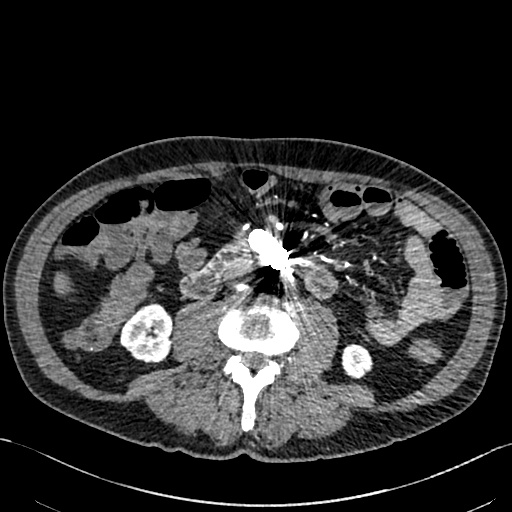
[im 165/225  soft-tissue]
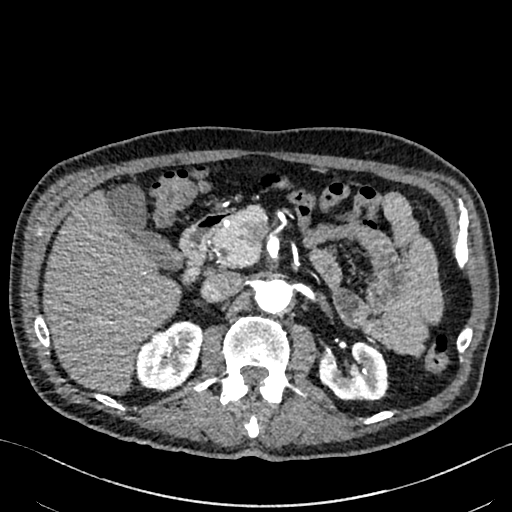
[im 165/225  lung]
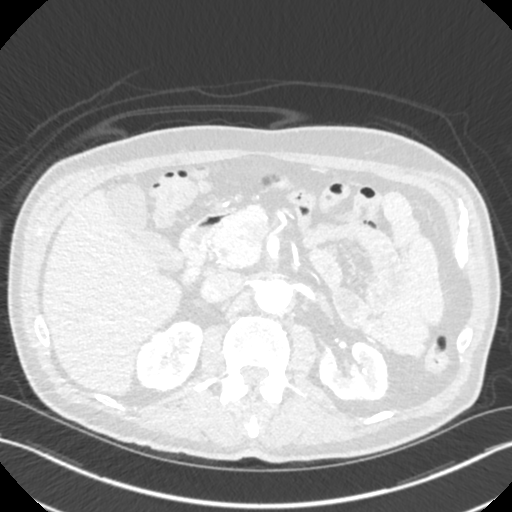
[im 180/225  soft-tissue]
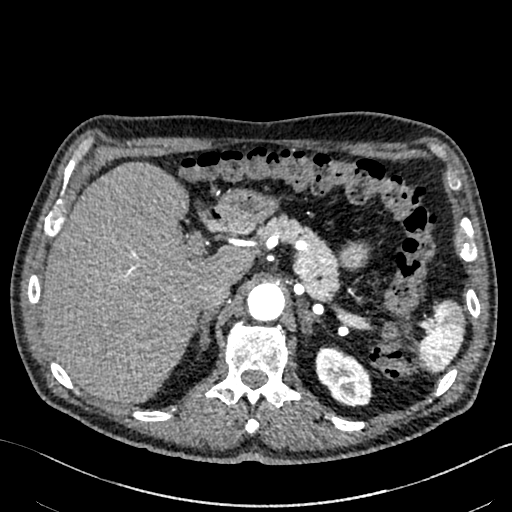
[im 180/225  lung]
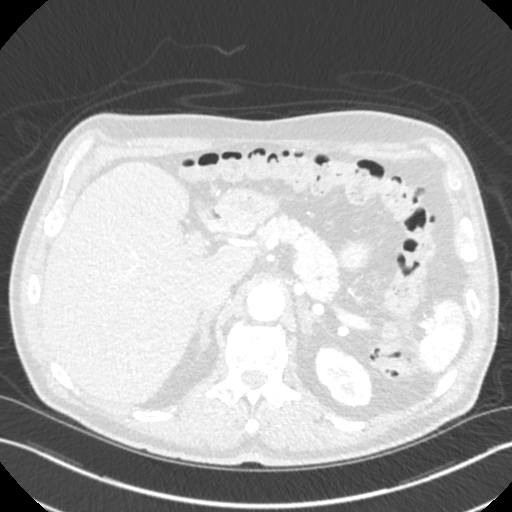
[im 195/225  lung]
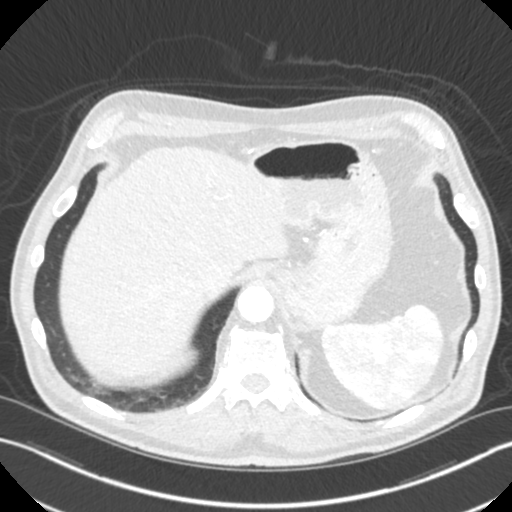
[im 210/225  soft-tissue]
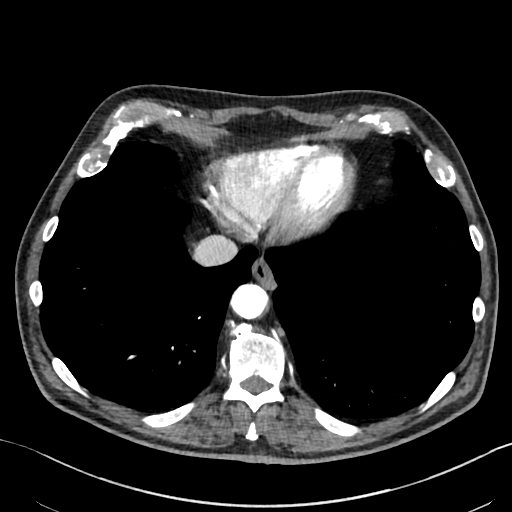
[im 210/225  lung]
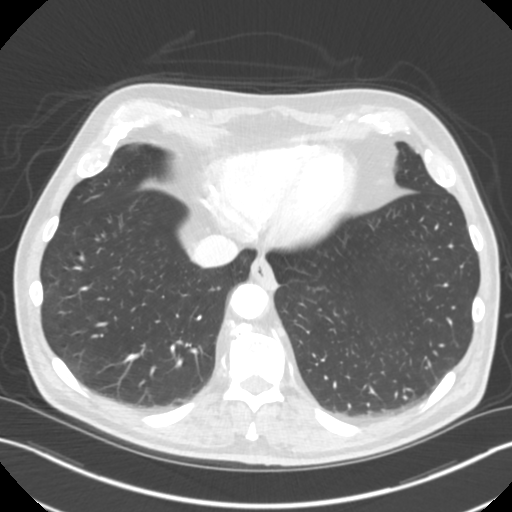
[im 210/225  bone]
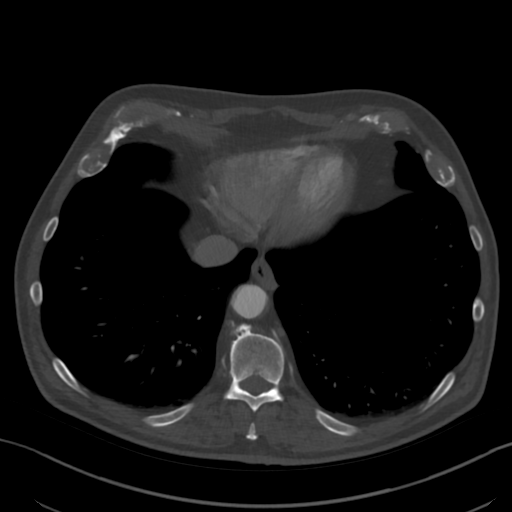

[Series 10: cta arterial 2.00 bv36 s3 cor art st · coronal · arterial · 0.75mm/px · 2 of 188 slices shown (2 of 2)]
[im 63/188  soft-tissue]
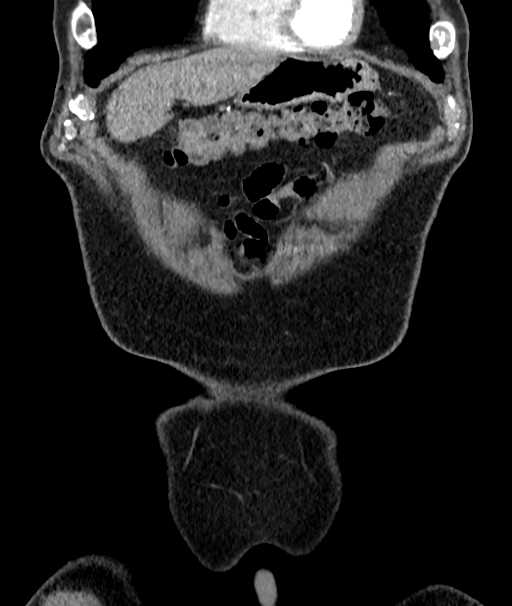
[im 125/188  soft-tissue]
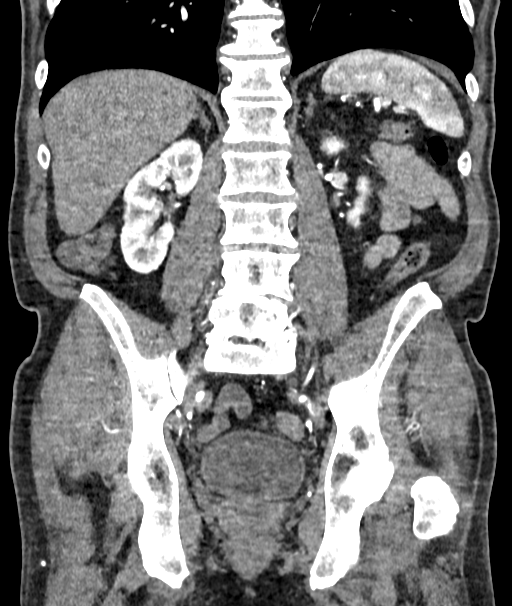

[12 of 46 positions shown; findings below may reference images not displayed]

FINDINGS: VASCULAR

Aorta: Mild atheromatous change in the visualized distal descending
thoracic and suprarenal segments. Patent infrarenal bifurcated stent
graft. Embolic material in the native aneurysm sac resulting in some
limited streak artifact. Maximum native sac diameter 3.8 cm. No
convincing endoleak.

Celiac: Patent without evidence of aneurysm, dissection, vasculitis
or significant stenosis.

SMA: Patent without evidence of aneurysm, dissection, vasculitis or
significant stenosis.

Renals: Both renal arteries are patent without evidence of aneurysm,
dissection, vasculitis, fibromuscular dysplasia or significant
stenosis.

IMA: Origin embolization, reconstituted distally by visceral
collaterals.

Inflow: On the right, stent graft extends to the distal common
iliac, stent tines relatively well apposed. Fusiform ectasia of
native internal iliac up to 1.1 cm. Tortuous native external iliac
with mild atheromatous change, no aneurysm or stenosis.

On the left, stent graft terminates in fusiform 2.8 cm mid/distal
common iliac aneurysm, stent tines incompletely apposed. Native
internal and external iliac arteries are atheromatous, ectatic,
patent.

Proximal Outflow: Atheromatous, patent

Veins: Retroaortic left renal vein, an anatomic variant. No venous
pathology identified.

Review of the MIP images confirms the above findings.

NON-VASCULAR

Lower chest: No pleural or pericardial effusion.

Hepatobiliary: No focal liver abnormality is seen. No gallstones,
gallbladder wall thickening, or biliary dilatation.

Pancreas: Unremarkable. No pancreatic ductal dilatation or
surrounding inflammatory changes.

Spleen: Normal in size without focal abnormality.

Adrenals/Urinary Tract: Adrenal glands unremarkable. Bilateral
nonobstructive urolithiasis, largest stone on the right 7 mm lower
pole, on the left 6 mm mid pole. No hydronephrosis. Urinary bladder
is physiologically distended.

Stomach/Bowel: Stomach incompletely distended, unremarkable. The
small bowel is decompressed. Appendix not identified. No pericecal
inflammatory change. The colon is nondilated. Scattered descending
and sigmoid segment diverticula without adjacent inflammatory
change.

Lymphatic: No abdominal or pelvic adenopathy.

Reproductive: Prostate is unremarkable.

Other: Left pelvic phlebolith.  No ascites.  No free air.

Musculoskeletal: Degenerative disc and facet disease L4-5. Interbody
fusion L5-S1. No fracture or worrisome bone lesion.
IMPRESSION: 1. Patent infrarenal aortic stent graft with no endoleak, stable
native sac diameter 3.8 cm.
2. 2.8 cm fusiform native left common iliac artery aneurysm,
previously 2.5 cm.
3. Bilateral nonobstructive urolithiasis.
4. Descending and sigmoid diverticulosis
